# Patient Record
Sex: Female | Born: 1960 | Race: White | Hispanic: No | Marital: Married | Smoking: Current every day smoker
Health system: Southern US, Community
[De-identification: ages and names within clinical notes are randomized; demographics above are authoritative.]

## PROBLEM LIST (undated history)

## (undated) DIAGNOSIS — F419 Anxiety disorder, unspecified: Secondary | ICD-10-CM

## (undated) DIAGNOSIS — K219 Gastro-esophageal reflux disease without esophagitis: Secondary | ICD-10-CM

## (undated) DIAGNOSIS — C3490 Malignant neoplasm of unspecified part of unspecified bronchus or lung: Secondary | ICD-10-CM

## (undated) DIAGNOSIS — Z720 Tobacco use: Secondary | ICD-10-CM

## (undated) DIAGNOSIS — F32A Depression, unspecified: Secondary | ICD-10-CM

## (undated) DIAGNOSIS — J439 Emphysema, unspecified: Secondary | ICD-10-CM

## (undated) DIAGNOSIS — M810 Age-related osteoporosis without current pathological fracture: Secondary | ICD-10-CM

## (undated) HISTORY — DX: Malignant neoplasm of unspecified part of unspecified bronchus or lung: C34.90

## (undated) HISTORY — PX: TUBAL LIGATION: SHX77

## (undated) HISTORY — PX: HERNIA REPAIR: SHX51

## (undated) HISTORY — DX: Age-related osteoporosis without current pathological fracture: M81.0

## (undated) HISTORY — PX: CLEFT PALATE REPAIR: SUR1165

## (undated) HISTORY — DX: Depression, unspecified: F32.A

## (undated) HISTORY — DX: Anxiety disorder, unspecified: F41.9

## (undated) HISTORY — DX: Gastro-esophageal reflux disease without esophagitis: K21.9

## (undated) HISTORY — DX: Tobacco use: Z72.0

## (undated) HISTORY — DX: Emphysema, unspecified: J43.9

## (undated) HISTORY — PX: BACK SURGERY: SHX140

---

## 1998-03-06 ENCOUNTER — Emergency Department (HOSPITAL_COMMUNITY): Admission: EM | Admit: 1998-03-06 | Discharge: 1998-03-06 | Payer: Self-pay | Admitting: Emergency Medicine

## 1998-03-09 ENCOUNTER — Ambulatory Visit (HOSPITAL_COMMUNITY): Admission: RE | Admit: 1998-03-09 | Discharge: 1998-03-09 | Payer: Self-pay | Admitting: Obstetrics and Gynecology

## 1998-04-20 ENCOUNTER — Encounter: Admission: RE | Admit: 1998-04-20 | Discharge: 1998-06-30 | Payer: Self-pay | Admitting: Anesthesiology

## 1998-08-06 ENCOUNTER — Encounter: Payer: Self-pay | Admitting: Orthopedic Surgery

## 1998-08-06 ENCOUNTER — Inpatient Hospital Stay (HOSPITAL_COMMUNITY): Admission: RE | Admit: 1998-08-06 | Discharge: 1998-08-10 | Payer: Self-pay | Admitting: Orthopedic Surgery

## 1999-05-25 ENCOUNTER — Other Ambulatory Visit: Admission: RE | Admit: 1999-05-25 | Discharge: 1999-05-25 | Payer: Self-pay | Admitting: Obstetrics and Gynecology

## 1999-06-07 ENCOUNTER — Encounter: Admission: RE | Admit: 1999-06-07 | Discharge: 1999-09-05 | Payer: Self-pay | Admitting: Obstetrics and Gynecology

## 1999-12-19 ENCOUNTER — Encounter: Admission: RE | Admit: 1999-12-19 | Discharge: 2000-03-18 | Payer: Self-pay | Admitting: Anesthesiology

## 2000-12-07 ENCOUNTER — Other Ambulatory Visit: Admission: RE | Admit: 2000-12-07 | Discharge: 2000-12-07 | Payer: Self-pay | Admitting: Obstetrics and Gynecology

## 2002-01-03 ENCOUNTER — Other Ambulatory Visit: Admission: RE | Admit: 2002-01-03 | Discharge: 2002-01-03 | Payer: Self-pay | Admitting: Obstetrics and Gynecology

## 2003-11-05 ENCOUNTER — Emergency Department (HOSPITAL_COMMUNITY): Admission: EM | Admit: 2003-11-05 | Discharge: 2003-11-05 | Payer: Self-pay | Admitting: Emergency Medicine

## 2005-02-08 ENCOUNTER — Emergency Department (HOSPITAL_COMMUNITY): Admission: EM | Admit: 2005-02-08 | Discharge: 2005-02-08 | Payer: Self-pay | Admitting: Emergency Medicine

## 2005-04-16 ENCOUNTER — Emergency Department (HOSPITAL_COMMUNITY): Admission: EM | Admit: 2005-04-16 | Discharge: 2005-04-16 | Payer: Self-pay | Admitting: Emergency Medicine

## 2005-04-19 ENCOUNTER — Emergency Department (HOSPITAL_COMMUNITY): Admission: EM | Admit: 2005-04-19 | Discharge: 2005-04-20 | Payer: Self-pay | Admitting: Emergency Medicine

## 2005-04-20 ENCOUNTER — Emergency Department (HOSPITAL_COMMUNITY): Admission: EM | Admit: 2005-04-20 | Discharge: 2005-04-20 | Payer: Self-pay | Admitting: Family Medicine

## 2013-08-01 ENCOUNTER — Other Ambulatory Visit (HOSPITAL_COMMUNITY)
Admission: RE | Admit: 2013-08-01 | Discharge: 2013-08-01 | Disposition: A | Discharge status: Home / Self Care | Payer: BC Managed Care – PPO | Source: Ambulatory Visit | Attending: Obstetrics & Gynecology | Admitting: Obstetrics & Gynecology

## 2013-08-01 ENCOUNTER — Other Ambulatory Visit: Payer: Self-pay | Admitting: Obstetrics & Gynecology

## 2013-08-01 DIAGNOSIS — Z1151 Encounter for screening for human papillomavirus (HPV): Secondary | ICD-10-CM | POA: Insufficient documentation

## 2013-08-01 DIAGNOSIS — Z01419 Encounter for gynecological examination (general) (routine) without abnormal findings: Secondary | ICD-10-CM | POA: Insufficient documentation

## 2014-03-06 ENCOUNTER — Ambulatory Visit (INDEPENDENT_AMBULATORY_CARE_PROVIDER_SITE_OTHER): Payer: BC Managed Care – PPO | Admitting: Cardiology

## 2014-03-06 ENCOUNTER — Encounter: Payer: Self-pay | Admitting: Cardiology

## 2014-03-06 VITALS — BP 146/92 | HR 70 | Ht 64.0 in | Wt 128.0 lb

## 2014-03-06 DIAGNOSIS — R072 Precordial pain: Secondary | ICD-10-CM

## 2014-03-06 DIAGNOSIS — Z720 Tobacco use: Secondary | ICD-10-CM | POA: Insufficient documentation

## 2014-03-06 DIAGNOSIS — R55 Syncope and collapse: Secondary | ICD-10-CM

## 2014-03-06 DIAGNOSIS — R079 Chest pain, unspecified: Secondary | ICD-10-CM | POA: Insufficient documentation

## 2014-03-06 DIAGNOSIS — F17200 Nicotine dependence, unspecified, uncomplicated: Secondary | ICD-10-CM

## 2014-03-06 NOTE — Progress Notes (Signed)
     HPI: 53 yo female for evaluation of chest pain. Exercise treadmill performed in [facility] in August of 2015 showed no chest pain and no ST changes. Echocardiogram August 2015 showed normal LV function. Mild mitral and tricuspid regurgitation. Patient was in [place] approximately one month ago. While in a casino she developed sudden nausea. She went to the bathroom and then developed diaphoresis as well as pain in the epigastric/left breast area described as a squeezing sensation. There is radiation to her left upper extremity. Her pain lasted approximately 20 minutes. She denies shortness of breath. She left the bathroom and then developed weakness and laid on the floor. Apparently EMS was called and her blood pressure was somewhat low. She declined to go to the hospital. She subsequently had the above tests performed after returning home. She has had 2 brief episodes when she felt her symptoms might return but otherwise denies dyspnea on exertion, orthopnea, PND, pedal edema, palpitations, syncope or exertional chest pain.  Current Outpatient Prescriptions  Medication Sig Dispense Refill  . estrogen, conjugated,-medroxyprogesterone (PREMPRO) 0.3-1.5 MG per tablet Take 1 tablet by mouth daily.       No current facility-administered medications for this visit.    No Known Allergies   Past Medical History  Diagnosis Date  . Tobacco use     Past Surgical History  Procedure Laterality Date  . Back surgery    . Tubal ligation    . Cleft palate repair      History   Social History  . Marital Status: Married    Spouse Name: N/A    Number of Children: 2  . Years of Education: N/A   Occupational History  .      Customer Financial planner   Social History Main Topics  . Smoking status: Current Every Day Smoker -- 1.00 packs/day    Types: Cigarettes  . Smokeless tobacco: Not on file  . Alcohol Use: No  . Drug Use: Not on file  . Sexual Activity: Not on file    Other Topics Concern  . Not on file   Social History Narrative  . No narrative on file    Family History  Problem Relation Age of Onset  . CAD Mother     CABG at age 62    ROS: no fevers or chills, productive cough, hemoptysis, dysphasia, odynophagia, melena, hematochezia, dysuria, hematuria, rash, seizure activity, orthopnea, PND, pedal edema, claudication. Remaining systems are negative.  Physical Exam:   Blood pressure 146/92, pulse 70, height  (1.626 m), weight 128 lb (58.06 kg).  General:  Well developed/well nourished in NAD Skin warm/dry Patient not depressed No peripheral clubbing Back-normal HEENT-normal/normal eyelids Neck supple/normal carotid upstroke bilaterally; no bruits; no JVD; no thyromegaly chest - CTA/ normal expansion CV - RRR/normal S1 and S2; no murmurs, rubs or gallops;  PMI nondisplaced Abdomen -NT/ND, no HSM, no mass, + bowel sounds, no bruit 2+ femoral pulses, no bruits Ext-no edema, chords, 2+ DP Neuro-grossly nonfocal  ECG Sinus rhythm at a rate of 52. No ST changes.

## 2014-03-06 NOTE — Assessment & Plan Note (Signed)
Etiology of symptoms unclear. Heart description is concerning for cardiac etiology. However she has had no exertional symptoms since returning home. Recent exercise treadmill negative. Echocardiogram showed normal LV function. Electrocardiogram today normal. Question gallbladder. Since she does not have right upper quadrant tenderness. I have elected to follow her. If she has no further symptoms then we will not pursue further workup. If she has further episodes in the future we will consider gallbladder ultrasound or further cardiac workup as needed.

## 2014-03-06 NOTE — Patient Instructions (Signed)
Your physician recommends that you schedule a follow-up appointment in: 3 MONTHS WITH DR CRENSHAW  

## 2014-03-06 NOTE — Assessment & Plan Note (Signed)
Patient counseled on discontinuing. 

## 2014-05-25 NOTE — Progress Notes (Signed)
      HPI: FU chest pain. Exercise treadmill performed in [facility] in August of 2015 showed no chest pain and no ST changes. Echocardiogram August 2015 showed normal LV function. Mild mitral and tricuspid regurgitation. At last office visit we electedObservation. If she had any recurrent symptoms we would consider further cardiac evaluation or gallbladder ultrasound. Since she was last seen,   Current Outpatient Prescriptions  Medication Sig Dispense Refill  . estrogen, conjugated,-medroxyprogesterone (PREMPRO) 0.3-1.5 MG per tablet Take 1 tablet by mouth daily.     No current facility-administered medications for this visit.     Past Medical History  Diagnosis Date  . Tobacco use     Past Surgical History  Procedure Laterality Date  . Back surgery    . Tubal ligation    . Cleft palate repair      History   Social History  . Marital Status: Married    Spouse Name: N/A    Number of Children: 2  . Years of Education: N/A   Occupational History  .      Customer Financial plannerservice manager   Social History Main Topics  . Smoking status: Current Every Day Smoker -- 1.00 packs/day    Types: Cigarettes  . Smokeless tobacco: Not on file  . Alcohol Use: No  . Drug Use: Not on file  . Sexual Activity: Not on file   Other Topics Concern  . Not on file   Social History Narrative  . No narrative on file    ROS: no fevers or chills, productive cough, hemoptysis, dysphasia, odynophagia, melena, hematochezia, dysuria, hematuria, rash, seizure activity, orthopnea, PND, pedal edema, claudication. Remaining systems are negative.  Physical Exam: Well-developed well-nourished in no acute distress.  Skin is warm and dry.  HEENT is normal.  Neck is supple.  Chest is clear to auscultation with normal expansion.  Cardiovascular exam is regular rate and rhythm.  Abdominal exam nontender or distended. No masses palpated. Extremities show no edema. neuro grossly  intact  ECG     This encounter was created in error - please disregard.

## 2014-05-29 ENCOUNTER — Encounter: Payer: BC Managed Care – PPO | Admitting: Cardiology

## 2014-08-10 ENCOUNTER — Ambulatory Visit: Payer: BC Managed Care – PPO | Admitting: Cardiology

## 2014-10-07 NOTE — Progress Notes (Signed)
      HPI: FU chest pain. Exercise treadmill performed in [facility] in August of 2015 showed no chest pain and no ST changes. Echocardiogram August 2015 showed normal LV function. Mild mitral and tricuspid regurgitation. Since last seen,    Current Outpatient Prescriptions  Medication Sig Dispense Refill  . estrogen, conjugated,-medroxyprogesterone (PREMPRO) 0.3-1.5 MG per tablet Take 1 tablet by mouth daily.     No current facility-administered medications for this visit.     Past Medical History  Diagnosis Date  . Tobacco use     Past Surgical History  Procedure Laterality Date  . Back surgery    . Tubal ligation    . Cleft palate repair      History   Social History  . Marital Status: Married    Spouse Name: N/A  . Number of Children: 2  . Years of Education: N/A   Occupational History  .      Customer Financial plannerservice manager   Social History Main Topics  . Smoking status: Current Every Day Smoker -- 1.00 packs/day    Types: Cigarettes  . Smokeless tobacco: Not on file  . Alcohol Use: No  . Drug Use: Not on file  . Sexual Activity: Not on file   Other Topics Concern  . Not on file   Social History Narrative  . No narrative on file    ROS: no fevers or chills, productive cough, hemoptysis, dysphasia, odynophagia, melena, hematochezia, dysuria, hematuria, rash, seizure activity, orthopnea, PND, pedal edema, claudication. Remaining systems are negative.  Physical Exam: Well-developed well-nourished in no acute distress.  Skin is warm and dry.  HEENT is normal.  Neck is supple.  Chest is clear to auscultation with normal expansion.  Cardiovascular exam is regular rate and rhythm.  Abdominal exam nontender or distended. No masses palpated. Extremities show no edema. neuro grossly intact  ECG     This encounter was created in error - please disregard.

## 2014-10-12 ENCOUNTER — Encounter: Payer: Self-pay | Admitting: Cardiology

## 2015-09-07 ENCOUNTER — Other Ambulatory Visit: Payer: Self-pay

## 2015-09-07 DIAGNOSIS — Z1231 Encounter for screening mammogram for malignant neoplasm of breast: Secondary | ICD-10-CM

## 2015-09-20 ENCOUNTER — Ambulatory Visit
Admission: RE | Admit: 2015-09-20 | Discharge: 2015-09-20 | Disposition: A | Discharge status: Home / Self Care | Payer: BLUE CROSS/BLUE SHIELD | Source: Ambulatory Visit

## 2015-09-20 DIAGNOSIS — Z1231 Encounter for screening mammogram for malignant neoplasm of breast: Secondary | ICD-10-CM

## 2016-07-12 ENCOUNTER — Other Ambulatory Visit: Payer: Self-pay | Admitting: Obstetrics & Gynecology

## 2016-07-12 ENCOUNTER — Other Ambulatory Visit (HOSPITAL_COMMUNITY)
Admission: RE | Admit: 2016-07-12 | Discharge: 2016-07-12 | Disposition: A | Discharge status: Home / Self Care | Payer: BLUE CROSS/BLUE SHIELD | Source: Ambulatory Visit | Attending: Obstetrics & Gynecology | Admitting: Obstetrics & Gynecology

## 2016-07-12 DIAGNOSIS — Z1151 Encounter for screening for human papillomavirus (HPV): Secondary | ICD-10-CM | POA: Insufficient documentation

## 2016-07-12 DIAGNOSIS — Z01419 Encounter for gynecological examination (general) (routine) without abnormal findings: Secondary | ICD-10-CM | POA: Insufficient documentation

## 2016-07-14 LAB — CYTOLOGY - PAP
Adequacy: ABSENT
Diagnosis: NEGATIVE
HPV: NOT DETECTED

## 2016-11-16 ENCOUNTER — Ambulatory Visit: Payer: Self-pay | Admitting: Podiatry

## 2016-12-20 ENCOUNTER — Ambulatory Visit (INDEPENDENT_AMBULATORY_CARE_PROVIDER_SITE_OTHER): Payer: BLUE CROSS/BLUE SHIELD | Admitting: Podiatry

## 2016-12-20 ENCOUNTER — Encounter: Payer: Self-pay | Admitting: Podiatry

## 2016-12-20 ENCOUNTER — Ambulatory Visit (INDEPENDENT_AMBULATORY_CARE_PROVIDER_SITE_OTHER): Payer: BLUE CROSS/BLUE SHIELD

## 2016-12-20 DIAGNOSIS — R52 Pain, unspecified: Secondary | ICD-10-CM

## 2016-12-20 DIAGNOSIS — M779 Enthesopathy, unspecified: Secondary | ICD-10-CM | POA: Diagnosis not present

## 2016-12-20 DIAGNOSIS — Q828 Other specified congenital malformations of skin: Secondary | ICD-10-CM

## 2016-12-20 DIAGNOSIS — M7732 Calcaneal spur, left foot: Secondary | ICD-10-CM | POA: Diagnosis not present

## 2016-12-20 NOTE — Progress Notes (Signed)
   Subjective:    Patient ID: Alexis Davila, female    DOB: 1960/07/29, 56 y.o.   MRN: [id number]  HPI this patient presents the office with 2 separate complaints. She says she has a painful callus under the ball of her left foot. She says that that pain has been pain painful and present for over 6 months. She says she works at [employer]  and walks on concrete during her shift. She also says that she is been experiencing pain in her left heel upon rising. This is not a frequent occurrence but does occur at times . She has provided no self treatment or sought any professional help for her heel pain. She denies any trauma to her left foot. She presents the office today for an evaluation and treatment of her painful left foot. No heel pain noted today    Review of Systems  All other systems reviewed and are negative.      Objective:   Physical Exam GENERAL APPEARANCE: Alert, conversant. Appropriately groomed. No acute distress.  VASCULAR: Pedal pulses are  palpable at  [facility] bilateral.  Capillary refill time is immediate to all digits,  Normal temperature gradient.  Digital hair growth is present bilateral  NEUROLOGIC: sensation is normal to 5.07 monofilament at 5/5 sites bilateral.  Light touch is intact bilateral, Muscle strength normal.  MUSCULOSKELETAL: acceptable muscle strength, tone and stability bilateral.  Intrinsic muscluature intact bilateral.  Rectus appearance of foot and digits noted bilateral. No pain at the insertion plantar fascia left foot.  Plantar pain sub 2 left foot.  DERMATOLOGIC: skin color, texture, and turgor are within normal limits.  No preulcerative lesions or ulcers  are seen, no interdigital maceration noted.  No open lesions present.  Digital nails are asymptomatic. No drainage noted. Porokeratotic lesion sub 2 left foot.         Assessment & Plan:  Plantar fasciitis, left foot . Capsulitis second MPJ left foot  . Porokeratosis sub-2 left foot  IE   x-rays were taken and no bony pathology was noted, but it was determined she had an elongated first metatarsal both feet.  She was treated with debridement of the porokeratosis at this visit.  She was advised to pick up three-quarter Spenco orthotic insole from [employer] and wear when she works.  Discussed additional treatment such as medication and injection therapy.  If the problem persists we will proceed with one of the above treatments. Return to clinic when necessary   Helane GuntherGregory Analeah Brame DPM

## 2017-10-30 ENCOUNTER — Other Ambulatory Visit: Payer: Self-pay | Admitting: Obstetrics & Gynecology

## 2017-10-30 DIAGNOSIS — Z139 Encounter for screening, unspecified: Secondary | ICD-10-CM

## 2017-11-22 ENCOUNTER — Ambulatory Visit
Admission: RE | Admit: 2017-11-22 | Discharge: 2017-11-22 | Disposition: A | Discharge status: Home / Self Care | Payer: BLUE CROSS/BLUE SHIELD | Source: Ambulatory Visit | Attending: Obstetrics & Gynecology | Admitting: Obstetrics & Gynecology

## 2017-11-22 DIAGNOSIS — Z139 Encounter for screening, unspecified: Secondary | ICD-10-CM

## 2019-07-21 ENCOUNTER — Other Ambulatory Visit: Payer: Self-pay | Admitting: Obstetrics & Gynecology

## 2019-07-21 DIAGNOSIS — Z1231 Encounter for screening mammogram for malignant neoplasm of breast: Secondary | ICD-10-CM

## 2019-08-27 ENCOUNTER — Ambulatory Visit
Admission: RE | Admit: 2019-08-27 | Discharge: 2019-08-27 | Disposition: A | Discharge status: Home / Self Care | Payer: BC Managed Care – PPO | Source: Ambulatory Visit | Attending: Obstetrics & Gynecology | Admitting: Obstetrics & Gynecology

## 2019-08-27 ENCOUNTER — Other Ambulatory Visit: Payer: Self-pay

## 2019-08-27 DIAGNOSIS — Z1231 Encounter for screening mammogram for malignant neoplasm of breast: Secondary | ICD-10-CM

## 2020-07-02 IMAGING — MG DIGITAL SCREENING BILAT W/ TOMO W/ CAD
8 series · 9 of 24 positions shown · non-contrast
Comparison: Previous exam(s).

CLINICAL DATA: Screening.

EXAM:
DIGITAL SCREENING BILATERAL MAMMOGRAM WITH TOMO AND CAD

[R MLO synth-2D]
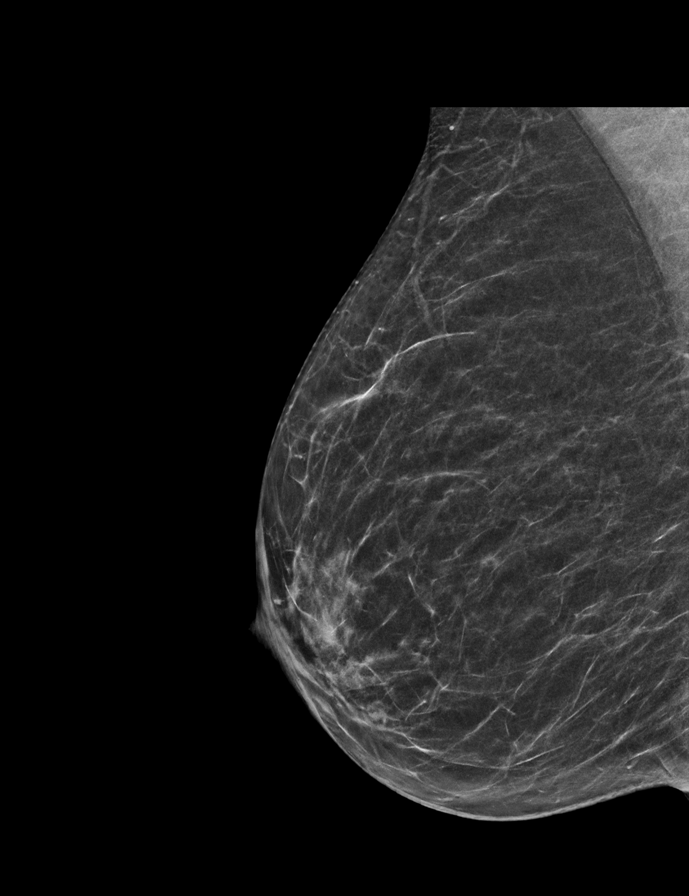

[R CC synth-2D]
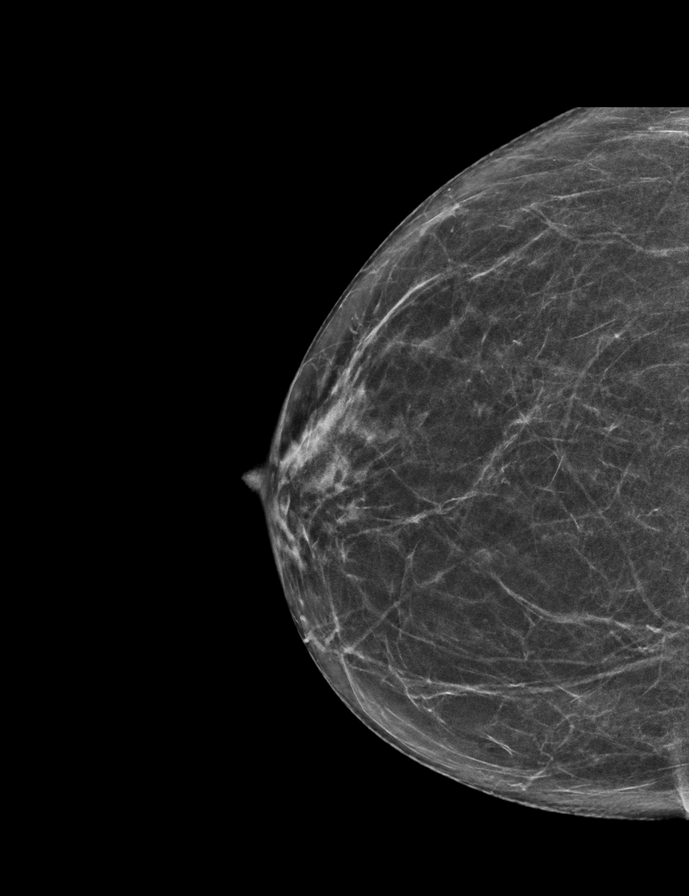

[L MLO synth-2D]
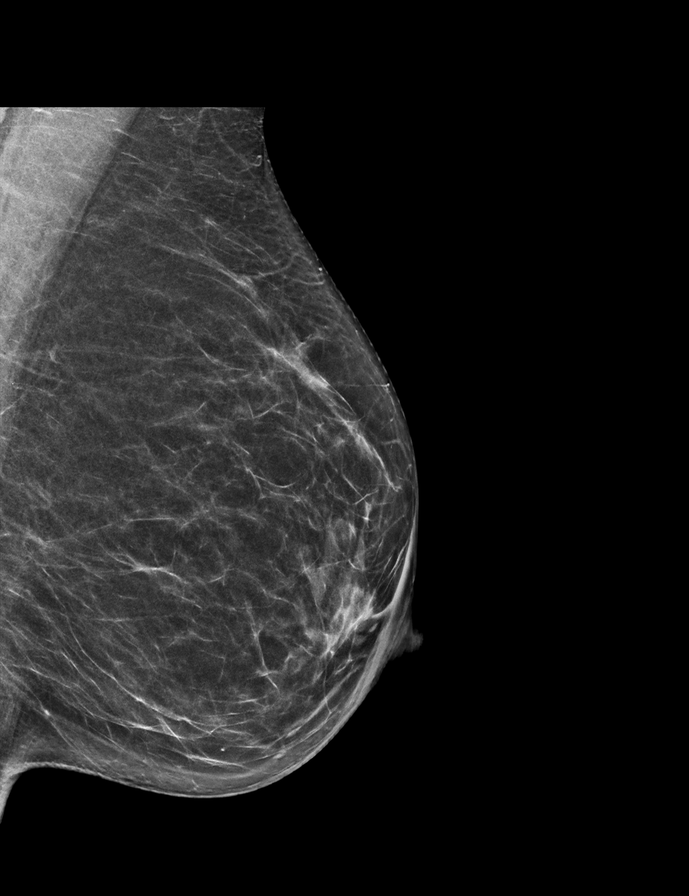

[L CC synth-2D]
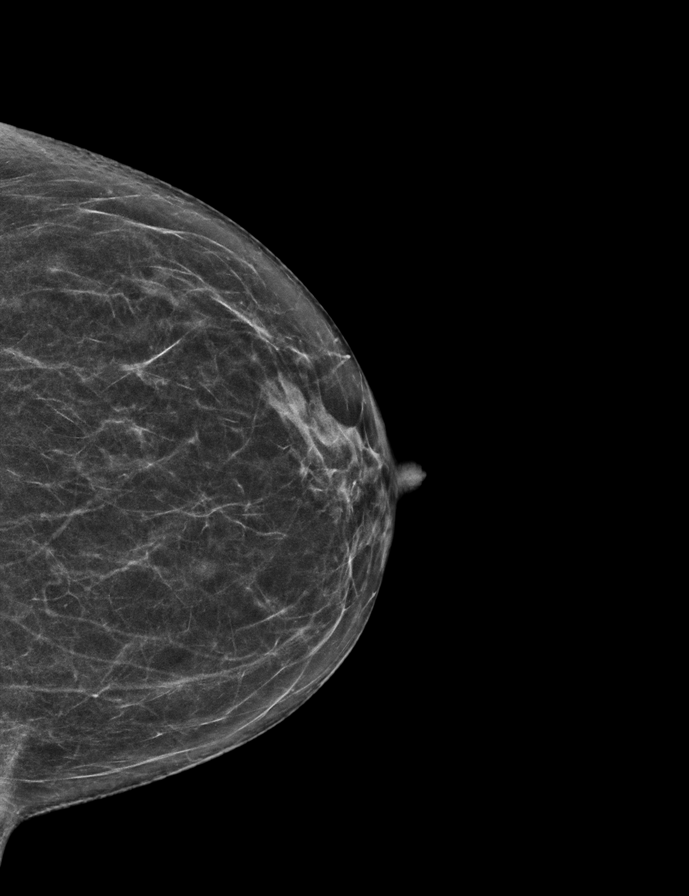

[R CC tomo · 2 of 53 frames shown]
[frame 18/53]
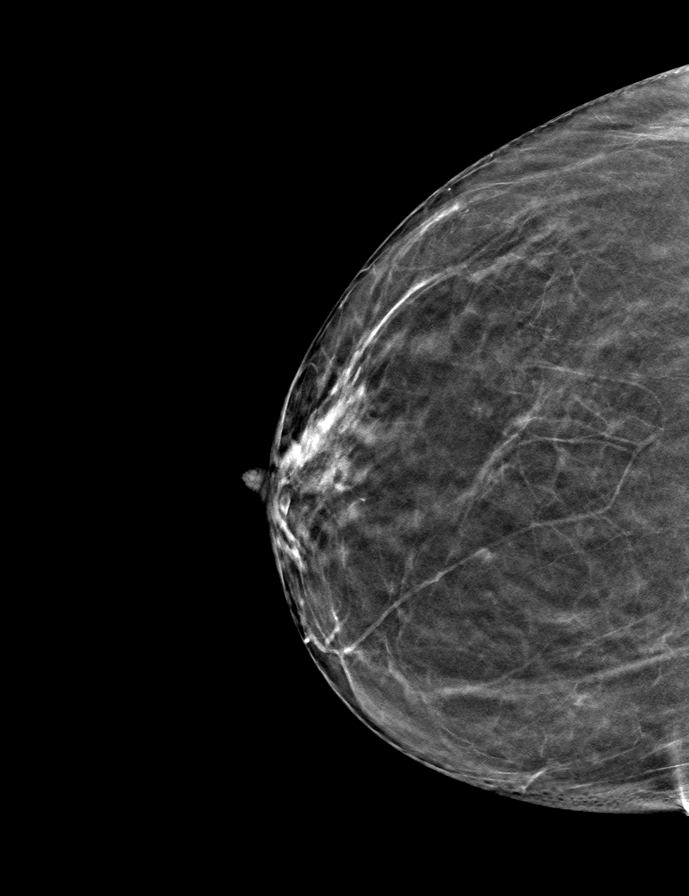
[frame 27/53]
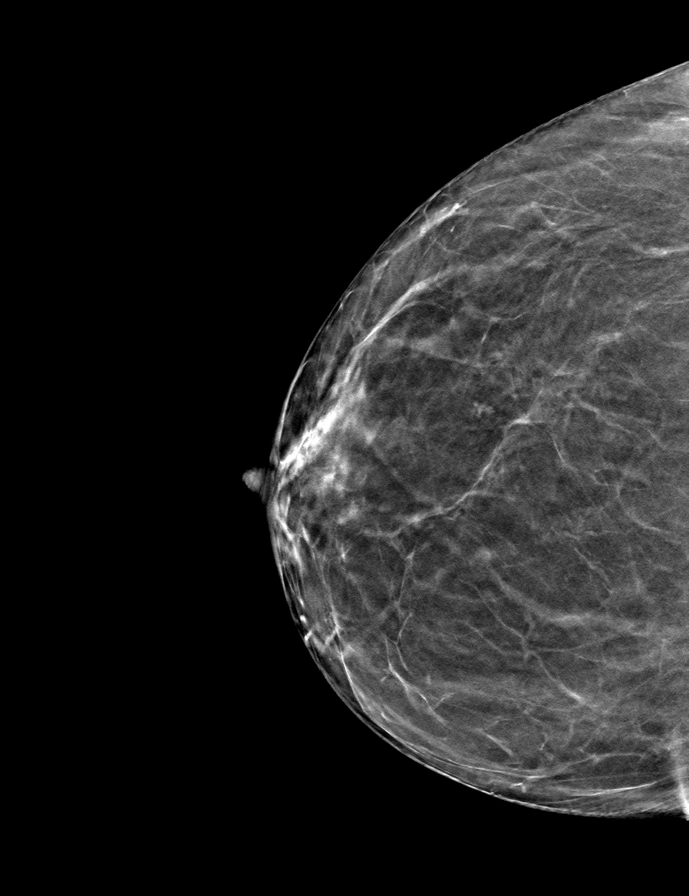

[L MLO tomo · tomo slice 30/59.0]
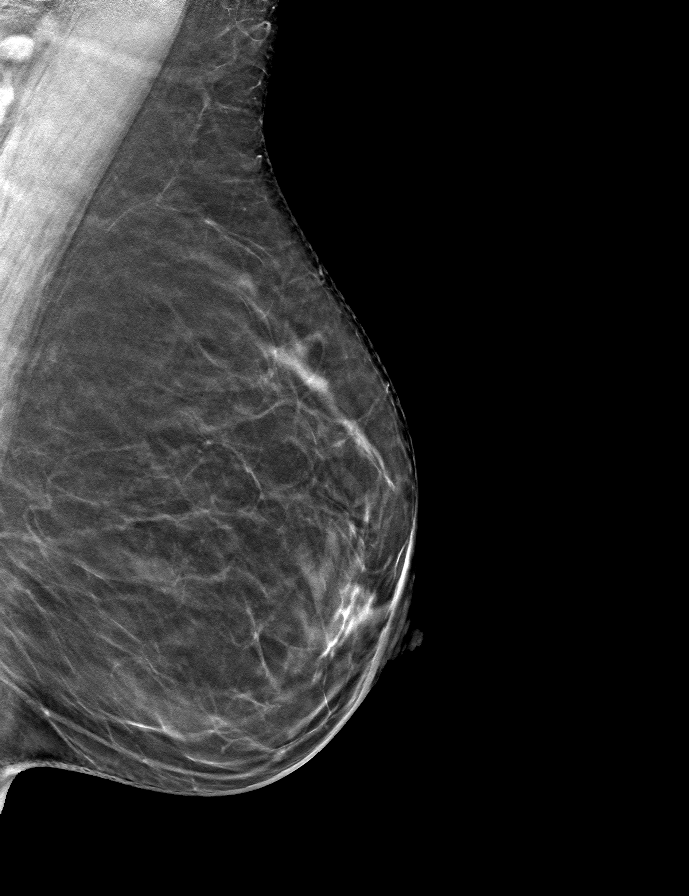

[L CC tomo · tomo slice 27/54.0]
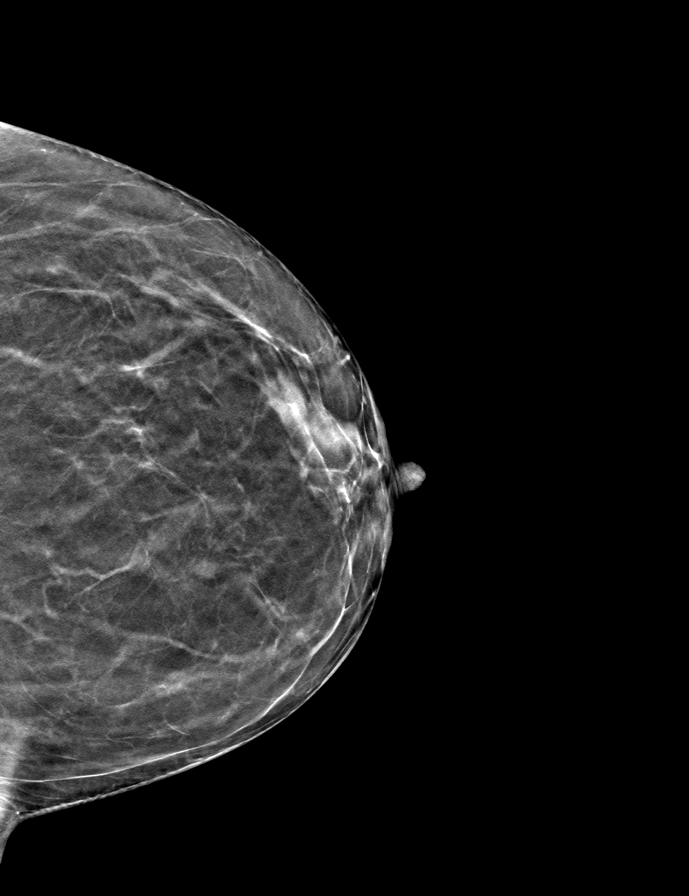

[R MLO tomo · tomo slice 29/56.0]
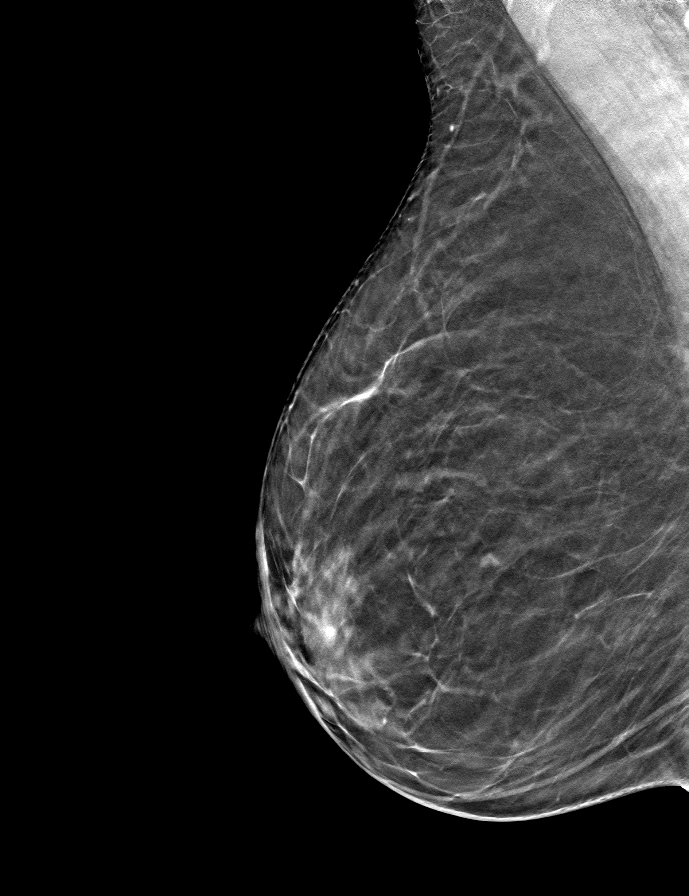

[9 of 24 positions shown; findings below may reference images not displayed]

ACR Breast Density Category b: There are scattered areas of
fibroglandular density.
FINDINGS: There are no findings suspicious for malignancy. Images were
processed with CAD.
IMPRESSION: No mammographic evidence of malignancy. A result letter of this
screening mammogram will be mailed directly to the patient.

RECOMMENDATION:
Screening mammogram in one year. (Code:[id number])

BI-RADS CATEGORY  1: Negative.

## 2021-06-01 DIAGNOSIS — K4090 Unilateral inguinal hernia, without obstruction or gangrene, not specified as recurrent: Secondary | ICD-10-CM | POA: Diagnosis not present

## 2021-11-17 DIAGNOSIS — J3489 Other specified disorders of nose and nasal sinuses: Secondary | ICD-10-CM | POA: Diagnosis not present

## 2021-11-17 DIAGNOSIS — Z716 Tobacco abuse counseling: Secondary | ICD-10-CM | POA: Diagnosis not present

## 2021-11-17 DIAGNOSIS — J9801 Acute bronchospasm: Secondary | ICD-10-CM | POA: Diagnosis not present

## 2021-11-17 DIAGNOSIS — F17210 Nicotine dependence, cigarettes, uncomplicated: Secondary | ICD-10-CM | POA: Diagnosis not present

## 2021-11-29 DIAGNOSIS — E119 Type 2 diabetes mellitus without complications: Secondary | ICD-10-CM | POA: Diagnosis not present

## 2021-11-29 DIAGNOSIS — K219 Gastro-esophageal reflux disease without esophagitis: Secondary | ICD-10-CM | POA: Diagnosis not present

## 2021-11-29 DIAGNOSIS — E559 Vitamin D deficiency, unspecified: Secondary | ICD-10-CM | POA: Diagnosis not present

## 2021-11-29 DIAGNOSIS — E7800 Pure hypercholesterolemia, unspecified: Secondary | ICD-10-CM | POA: Diagnosis not present

## 2021-11-29 DIAGNOSIS — F430 Acute stress reaction: Secondary | ICD-10-CM | POA: Diagnosis not present

## 2022-01-23 DIAGNOSIS — F430 Acute stress reaction: Secondary | ICD-10-CM | POA: Diagnosis not present

## 2022-01-23 DIAGNOSIS — F419 Anxiety disorder, unspecified: Secondary | ICD-10-CM | POA: Diagnosis not present

## 2022-04-20 DIAGNOSIS — J209 Acute bronchitis, unspecified: Secondary | ICD-10-CM | POA: Diagnosis not present

## 2022-04-20 DIAGNOSIS — K4090 Unilateral inguinal hernia, without obstruction or gangrene, not specified as recurrent: Secondary | ICD-10-CM | POA: Diagnosis not present

## 2022-05-15 DIAGNOSIS — Z1211 Encounter for screening for malignant neoplasm of colon: Secondary | ICD-10-CM | POA: Diagnosis not present

## 2022-05-15 DIAGNOSIS — K4090 Unilateral inguinal hernia, without obstruction or gangrene, not specified as recurrent: Secondary | ICD-10-CM | POA: Diagnosis not present

## 2022-06-07 DIAGNOSIS — Z79899 Other long term (current) drug therapy: Secondary | ICD-10-CM | POA: Diagnosis not present

## 2022-06-07 DIAGNOSIS — K4090 Unilateral inguinal hernia, without obstruction or gangrene, not specified as recurrent: Secondary | ICD-10-CM | POA: Diagnosis not present

## 2022-08-14 DIAGNOSIS — Z01818 Encounter for other preprocedural examination: Secondary | ICD-10-CM | POA: Diagnosis not present

## 2022-08-30 DIAGNOSIS — R0781 Pleurodynia: Secondary | ICD-10-CM | POA: Diagnosis not present

## 2022-10-03 DIAGNOSIS — M7711 Lateral epicondylitis, right elbow: Secondary | ICD-10-CM | POA: Diagnosis not present

## 2022-11-17 DIAGNOSIS — Z0000 Encounter for general adult medical examination without abnormal findings: Secondary | ICD-10-CM | POA: Diagnosis not present

## 2022-12-06 DIAGNOSIS — Z1231 Encounter for screening mammogram for malignant neoplasm of breast: Secondary | ICD-10-CM | POA: Diagnosis not present

## 2022-12-19 DIAGNOSIS — M7711 Lateral epicondylitis, right elbow: Secondary | ICD-10-CM | POA: Diagnosis not present

## 2022-12-19 DIAGNOSIS — G8929 Other chronic pain: Secondary | ICD-10-CM | POA: Diagnosis not present

## 2022-12-19 DIAGNOSIS — M545 Low back pain, unspecified: Secondary | ICD-10-CM | POA: Diagnosis not present

## 2022-12-25 DIAGNOSIS — M25521 Pain in right elbow: Secondary | ICD-10-CM | POA: Diagnosis not present

## 2022-12-25 DIAGNOSIS — S51851A Open bite of right forearm, initial encounter: Secondary | ICD-10-CM | POA: Diagnosis not present

## 2022-12-25 DIAGNOSIS — M7711 Lateral epicondylitis, right elbow: Secondary | ICD-10-CM | POA: Diagnosis not present

## 2023-01-09 DIAGNOSIS — M5451 Vertebrogenic low back pain: Secondary | ICD-10-CM | POA: Diagnosis not present

## 2023-02-16 DIAGNOSIS — R636 Underweight: Secondary | ICD-10-CM | POA: Diagnosis not present

## 2023-02-16 DIAGNOSIS — R1110 Vomiting, unspecified: Secondary | ICD-10-CM | POA: Diagnosis not present

## 2023-02-16 DIAGNOSIS — F3341 Major depressive disorder, recurrent, in partial remission: Secondary | ICD-10-CM | POA: Diagnosis not present

## 2023-02-16 DIAGNOSIS — Z13220 Encounter for screening for lipoid disorders: Secondary | ICD-10-CM | POA: Diagnosis not present

## 2023-02-16 DIAGNOSIS — K219 Gastro-esophageal reflux disease without esophagitis: Secondary | ICD-10-CM | POA: Diagnosis not present

## 2023-02-16 DIAGNOSIS — Z1389 Encounter for screening for other disorder: Secondary | ICD-10-CM | POA: Diagnosis not present

## 2023-02-16 DIAGNOSIS — Z681 Body mass index (BMI) 19 or less, adult: Secondary | ICD-10-CM | POA: Diagnosis not present

## 2023-02-19 ENCOUNTER — Other Ambulatory Visit: Payer: Self-pay | Admitting: Family Medicine

## 2023-02-19 DIAGNOSIS — F17200 Nicotine dependence, unspecified, uncomplicated: Secondary | ICD-10-CM

## 2023-03-08 ENCOUNTER — Ambulatory Visit
Admission: RE | Admit: 2023-03-08 | Discharge: 2023-03-08 | Disposition: A | Discharge status: Home / Self Care | Payer: BC Managed Care – PPO | Source: Ambulatory Visit | Attending: Family Medicine | Admitting: Family Medicine

## 2023-03-08 DIAGNOSIS — F17210 Nicotine dependence, cigarettes, uncomplicated: Secondary | ICD-10-CM | POA: Diagnosis not present

## 2023-03-08 DIAGNOSIS — F17200 Nicotine dependence, unspecified, uncomplicated: Secondary | ICD-10-CM

## 2023-03-09 DIAGNOSIS — R1110 Vomiting, unspecified: Secondary | ICD-10-CM | POA: Diagnosis not present

## 2023-03-09 DIAGNOSIS — R030 Elevated blood-pressure reading, without diagnosis of hypertension: Secondary | ICD-10-CM | POA: Diagnosis not present

## 2023-03-09 DIAGNOSIS — F3341 Major depressive disorder, recurrent, in partial remission: Secondary | ICD-10-CM | POA: Diagnosis not present

## 2023-03-09 DIAGNOSIS — F17210 Nicotine dependence, cigarettes, uncomplicated: Secondary | ICD-10-CM | POA: Diagnosis not present

## 2023-03-13 DIAGNOSIS — M7711 Lateral epicondylitis, right elbow: Secondary | ICD-10-CM | POA: Diagnosis not present

## 2023-03-21 ENCOUNTER — Ambulatory Visit (INDEPENDENT_AMBULATORY_CARE_PROVIDER_SITE_OTHER): Payer: BC Managed Care – PPO | Admitting: Pulmonary Disease

## 2023-03-21 ENCOUNTER — Encounter: Payer: Self-pay | Admitting: Pulmonary Disease

## 2023-03-21 VITALS — BP 140/80 | HR 76 | Ht 64.0 in | Wt 101.0 lb

## 2023-03-21 DIAGNOSIS — J432 Centrilobular emphysema: Secondary | ICD-10-CM | POA: Diagnosis not present

## 2023-03-21 DIAGNOSIS — R911 Solitary pulmonary nodule: Secondary | ICD-10-CM

## 2023-03-21 DIAGNOSIS — Z720 Tobacco use: Secondary | ICD-10-CM | POA: Diagnosis not present

## 2023-03-21 DIAGNOSIS — Z716 Tobacco abuse counseling: Secondary | ICD-10-CM | POA: Diagnosis not present

## 2023-03-21 NOTE — Patient Instructions (Signed)
Thank you for visiting Dr. Tonia Brooms at [facility]. Today we recommend the following: Orders Placed This Encounter  Procedures   NM PET Image Initial (PI) Skull Base To Thigh (F-18 FDG)   Pulmonary Function Test   Return in about 3 weeks (around 04/11/2023) for with Kandice Robinsons, NP, or Dr. Tonia Brooms, after PFTs, after PET/CT Chest.    Please do your part to reduce the spread of COVID-19.

## 2023-03-21 NOTE — Progress Notes (Signed)
Synopsis: Referred in September 2024 for pulmonary nodule by Orpha Bur, MD  Subjective:   PATIENT ID: Alexis Davila GENDER: female DOB: 27-Oct-1960, MRN: [id number]  Chief Complaint  Patient presents with   Consult    Consult on lung nodule.    This is a 62 year old female longstanding history of tobacco use, mother with a history of emphysema and history of coronary disease.  Patient has smoked for 40+ years.  Still smoking 1 pack/day.  She had her first lung cancer screening CT ordered by her primary care provider and this was completed in August 2024 which revealed a 1.7 cm right upper lobe pulmonary nodule concerning for malignancy.  Patient was referred for next best steps in management.    Past Medical History:  Diagnosis Date   Tobacco use      Family History  Problem Relation Age of Onset   CAD Mother        CABG at age 43   Breast cancer Neg Hx      Past Surgical History:  Procedure Laterality Date   BACK SURGERY     CLEFT PALATE REPAIR     TUBAL LIGATION      Social History   Socioeconomic History   Marital status: Married    Spouse name: Not on file   Number of children: 2   Years of education: Not on file   Highest education level: Not on file  Occupational History    Comment: Aeronautical engineer  Tobacco Use   Smoking status: Every Day    Current packs/day: 1.00    Types: Cigarettes   Smokeless tobacco: Never   Tobacco comments:    Smokes a pack of cigarettes a day. 03/21/2023 [place]  Substance and Sexual Activity   Alcohol use: No   Drug use: No   Sexual activity: Not on file  Other Topics Concern   Not on file  Social History Narrative   Not on file   Social Determinants of Health   Financial Resource Strain: Not on file  Food Insecurity: Not on file  Transportation Needs: Not on file  Physical Activity: Not on file  Stress: Not on file  Social Connections: Not on file  Intimate Partner Violence: Not on file     No  Known Allergies   Outpatient Medications Prior to Visit  Medication Sig Dispense Refill   escitalopram (LEXAPRO) 20 MG tablet Take 30 mg by mouth daily.     ondansetron (ZOFRAN-ODT) 4 MG disintegrating tablet Take 4 mg by mouth 3 (three) times daily as needed.     pantoprazole (PROTONIX) 40 MG tablet Take 40 mg by mouth daily.     promethazine (PHENERGAN) 25 MG tablet Take 12.5 mg by mouth every 8 (eight) hours as needed.     estrogen, conjugated,-medroxyprogesterone (PREMPRO) 0.3-1.5 MG per tablet Take 1 tablet by mouth daily.     No facility-administered medications prior to visit.    Review of Systems  Constitutional:  Positive for weight loss. Negative for chills, fever and malaise/fatigue.  HENT:  Negative for hearing loss, sore throat and tinnitus.   Eyes:  Negative for blurred vision and double vision.  Respiratory:  Positive for cough and shortness of breath. Negative for hemoptysis, sputum production, wheezing and stridor.   Cardiovascular:  Negative for chest pain, palpitations, orthopnea, leg swelling and PND.  Gastrointestinal:  Negative for abdominal pain, constipation, diarrhea, heartburn, nausea and vomiting.  Genitourinary:  Negative for dysuria, hematuria and  urgency.  Musculoskeletal:  Negative for joint pain and myalgias.  Skin:  Negative for itching and rash.  Neurological:  Negative for dizziness, tingling, weakness and headaches.  Endo/Heme/Allergies:  Negative for environmental allergies. Does not bruise/bleed easily.  Psychiatric/Behavioral:  Negative for depression. The patient is not nervous/anxious and does not have insomnia.   All other systems reviewed and are negative.    Objective:  Physical Exam Vitals reviewed.  Constitutional:      General: She is not in acute distress.    Appearance: She is well-developed.     Comments: Thin low BMI, 17  HENT:     Head: Normocephalic and atraumatic.     Mouth/Throat:     Pharynx: No oropharyngeal exudate.   Eyes:     Conjunctiva/sclera: Conjunctivae normal.     Pupils: Pupils are equal, round, and reactive to light.  Neck:     Vascular: No JVD.     Trachea: No tracheal deviation.     Comments: Loss of supraclavicular fat Cardiovascular:     Rate and Rhythm: Normal rate and regular rhythm.     Heart sounds: S1 normal and S2 normal.     Comments: Distant heart tones Pulmonary:     Effort: No tachypnea or accessory muscle usage.     Breath sounds: No stridor. Decreased breath sounds (throughout all lung fields) present. No wheezing, rhonchi or rales.  Abdominal:     General: Bowel sounds are normal. There is no distension.     Palpations: Abdomen is soft.     Tenderness: There is no abdominal tenderness.  Musculoskeletal:        General: Deformity (muscle wasting ) present.  Skin:    General: Skin is warm and dry.     Capillary Refill: Capillary refill takes less than 2 seconds.     Findings: No rash.  Neurological:     Mental Status: She is alert and oriented to person, place, and time.  Psychiatric:        Behavior: Behavior normal.      Vitals:   03/21/23 1435  BP: (!) 140/80  Pulse: 76  SpO2: 100%  Weight: 101 lb (45.8 kg)  Height: 5\' 4"  (1.626 m)   100% on RA BMI Readings from Last 3 Encounters:  03/21/23 17.34 kg/m  03/06/14 21.97 kg/m   Wt Readings from Last 3 Encounters:  03/21/23 101 lb (45.8 kg)  03/06/14 128 lb (58.1 kg)     CBC No results found for: "WBC", "RBC", "HGB", "HCT", "PLT", "MCV", "MCH", "MCHC", "RDW", "LYMPHSABS", "MONOABS", "EOSABS", "BASOSABS"   Chest Imaging:  CT lung cancer screening: 17 mm right upper lobe pulmonary nodule macrolobulated spiculated margins concerning for malignancy. The patient's images have been independently reviewed by me.    Pulmonary Functions Testing Results:     No data to display          FeNO:   Pathology:   Echocardiogram:   Heart Catheterization:     Assessment & Plan:     ICD-10-CM    1. Lung nodule  R91.1 Pulmonary Function Test    NM PET Image Initial (PI) Skull Base To Thigh (F-18 FDG)    2. Tobacco use  Z72.0     3. Centrilobular emphysema (HCC)  J43.2       Discussion:  This is a 62 year old female seen today for abnormal lung cancer screening CT.  She has a upper lobe nodule in the right lung concerning for malignancy.  Plan:  I think the next best step for her is to have a nuclear medicine PET scan as well as PFTs. Based on this we will decide whether or not she is a candidate for surgery. We counseled heavily today on smoking cessation. I think she is going to be able to quit and will be able to be considered for surgical resection. Based on the PET scan we can make recommendations on whether or not she has got any bronchoscopy and biopsy or the not she can go directly for surgery. Based on her functional status I think she would potentially be able to go directly for surgery.    Current Outpatient Medications:    escitalopram (LEXAPRO) 20 MG tablet, Take 30 mg by mouth daily., Disp: , Rfl:    ondansetron (ZOFRAN-ODT) 4 MG disintegrating tablet, Take 4 mg by mouth 3 (three) times daily as needed., Disp: , Rfl:    pantoprazole (PROTONIX) 40 MG tablet, Take 40 mg by mouth daily., Disp: , Rfl:    promethazine (PHENERGAN) 25 MG tablet, Take 12.5 mg by mouth every 8 (eight) hours as needed., Disp: , Rfl:    Josephine Igo, DO [facility] 03/21/2023 3:42 PM

## 2023-03-23 DIAGNOSIS — Z681 Body mass index (BMI) 19 or less, adult: Secondary | ICD-10-CM | POA: Diagnosis not present

## 2023-03-23 DIAGNOSIS — F430 Acute stress reaction: Secondary | ICD-10-CM | POA: Diagnosis not present

## 2023-03-23 DIAGNOSIS — R911 Solitary pulmonary nodule: Secondary | ICD-10-CM | POA: Diagnosis not present

## 2023-03-28 ENCOUNTER — Encounter: Payer: Self-pay | Admitting: Pulmonary Disease

## 2023-03-30 ENCOUNTER — Encounter (HOSPITAL_COMMUNITY)
Admission: RE | Admit: 2023-03-30 | Discharge: 2023-03-30 | Disposition: A | Discharge status: Home / Self Care | Payer: BC Managed Care – PPO | Source: Ambulatory Visit | Attending: Pulmonary Disease

## 2023-03-30 DIAGNOSIS — R911 Solitary pulmonary nodule: Secondary | ICD-10-CM | POA: Insufficient documentation

## 2023-03-30 LAB — GLUCOSE, CAPILLARY: Glucose-Capillary: 101 mg/dL — ABNORMAL HIGH (ref 70–99)

## 2023-03-30 MED ORDER — FLUDEOXYGLUCOSE F - 18 (FDG) INJECTION
4.4700 | Freq: Once | INTRAVENOUS | Status: AC
  Administered 2023-03-30: 4.47 via INTRAVENOUS

## 2023-04-02 ENCOUNTER — Ambulatory Visit: Payer: BC Managed Care – PPO | Admitting: Pulmonary Disease

## 2023-04-02 DIAGNOSIS — R911 Solitary pulmonary nodule: Secondary | ICD-10-CM

## 2023-04-02 LAB — PULMONARY FUNCTION TEST
DL/VA % pred: 58 %
DL/VA: 2.43 ml/min/mmHg/L
DLCO cor % pred: 57 %
DLCO cor: 12.2 ml/min/mmHg
DLCO unc % pred: 57 %
DLCO unc: 12.2 ml/min/mmHg
FEF 25-75 Post: 1.13 L/s
FEF 25-75 Pre: 1.2 L/s
FEF2575-%Change-Post: -5 %
FEF2575-%Pred-Post: 47 %
FEF2575-%Pred-Pre: 50 %
FEV1-%Change-Post: -2 %
FEV1-%Pred-Post: 76 %
FEV1-%Pred-Pre: 77 %
FEV1-Post: 2.03 L
FEV1-Pre: 2.08 L
FEV1FVC-%Change-Post: -3 %
FEV1FVC-%Pred-Pre: 86 %
FEV6-%Change-Post: 0 %
FEV6-%Pred-Post: 91 %
FEV6-%Pred-Pre: 91 %
FEV6-Post: 3.04 L
FEV6-Pre: 3.06 L
FEV6FVC-%Change-Post: -1 %
FEV6FVC-%Pred-Post: 101 %
FEV6FVC-%Pred-Pre: 102 %
FVC-%Change-Post: 1 %
FVC-%Pred-Post: 89 %
FVC-%Pred-Pre: 88 %
FVC-Post: 3.11 L
FVC-Pre: 3.08 L
Post FEV1/FVC ratio: 65 %
Post FEV6/FVC ratio: 98 %
Pre FEV1/FVC ratio: 67 %
Pre FEV6/FVC Ratio: 99 %
RV % pred: 128 %
RV: 2.68 L
TLC % pred: 108 %
TLC: 5.71 L

## 2023-04-02 NOTE — Progress Notes (Signed)
Full PFT performed today. °

## 2023-04-02 NOTE — Patient Instructions (Signed)
Full PFT performed today. °

## 2023-04-09 ENCOUNTER — Telehealth: Payer: Self-pay | Admitting: Pulmonary Disease

## 2023-04-09 NOTE — Telephone Encounter (Signed)
PET was on 03/30/2023.

## 2023-04-09 NOTE — Telephone Encounter (Signed)
Patient would like results of PET & CT scan. No available appointments until November 2024. Patient phone number is [phone].

## 2023-04-11 ENCOUNTER — Telehealth: Payer: Self-pay | Admitting: Adult Health

## 2023-04-11 ENCOUNTER — Ambulatory Visit: Payer: BC Managed Care – PPO | Admitting: Primary Care

## 2023-04-11 NOTE — Telephone Encounter (Signed)
Appt with TP for 04/16/23 to review the results

## 2023-04-11 NOTE — Telephone Encounter (Signed)
Pt needs to get a referral sent to oncology Dr. Debara Pickett  Ph: [phone]

## 2023-04-12 NOTE — Progress Notes (Signed)
Patient has appt with Tammy to discuss PET results on 04/16/2023  Thanks,  BLI  Josephine Igo, DO [facility] 04/12/2023 2:29 PM

## 2023-04-16 ENCOUNTER — Ambulatory Visit: Payer: BC Managed Care – PPO | Admitting: Adult Health

## 2023-04-16 ENCOUNTER — Encounter: Payer: Self-pay | Admitting: Adult Health

## 2023-04-16 VITALS — BP 100/80 | HR 81 | Temp 97.7°F | Ht 64.0 in | Wt 112.8 lb

## 2023-04-16 DIAGNOSIS — R490 Dysphonia: Secondary | ICD-10-CM

## 2023-04-16 DIAGNOSIS — J439 Emphysema, unspecified: Secondary | ICD-10-CM | POA: Diagnosis not present

## 2023-04-16 DIAGNOSIS — R911 Solitary pulmonary nodule: Secondary | ICD-10-CM

## 2023-04-16 DIAGNOSIS — Z720 Tobacco use: Secondary | ICD-10-CM

## 2023-04-16 DIAGNOSIS — F17210 Nicotine dependence, cigarettes, uncomplicated: Secondary | ICD-10-CM

## 2023-04-16 DIAGNOSIS — R948 Abnormal results of function studies of other organs and systems: Secondary | ICD-10-CM

## 2023-04-16 MED ORDER — SPIRIVA RESPIMAT 2.5 MCG/ACT IN AERS: 2.0000 | INHALATION_SPRAY | Freq: Every day | RESPIRATORY_TRACT | Status: DC

## 2023-04-16 MED ORDER — ALBUTEROL SULFATE HFA 108 (90 BASE) MCG/ACT IN AERS
1.0000 | INHALATION_SPRAY | Freq: Four times a day (QID) | RESPIRATORY_TRACT | 2 refills | Status: AC | PRN
Start: 2023-04-16 — End: ?

## 2023-04-16 MED ORDER — SPIRIVA RESPIMAT 2.5 MCG/ACT IN AERS: 2.0000 | INHALATION_SPRAY | Freq: Every day | RESPIRATORY_TRACT | 5 refills | Status: DC

## 2023-04-16 NOTE — Assessment & Plan Note (Signed)
Hypermetabolic right upper lobe lung nodule-we reviewed her CT chest and PET scan results in detail.  Right upper lobe lesion is highly suspicious for underlying malignancy.  We went over potential treatment plans including navigational bronchoscopy with biopsy and surgical resection.  PFTs show moderate COPD and diffusing capacity is greater than 50%.  She is fully independent working full-time.  Not oxygen dependent.  PET scan showed no distant disease.  We discussed referral to thoracic surgery for consideration of possible resection.  She has an upcoming appointment with medical oncology later this week.  If patient decides to not proceed with surgical resection we can set her up for navigational bronchoscopy with tissue sampling.

## 2023-04-16 NOTE — Progress Notes (Signed)
Patient seen in the office today and instructed on use of Spiriva.  Patient expressed understanding and demonstrated technique. 

## 2023-04-16 NOTE — Telephone Encounter (Signed)
Pt seen in clinic today by TP. Nothing further needed.

## 2023-04-16 NOTE — Assessment & Plan Note (Signed)
COPD with emphysema-Gold B, stage II COPD-patient has moderate airflow obstruction on PFT.  She has minimum symptom burden.  Has some intermittent cough and shortness of breath.  She is fully independent.  For now we will place on Spiriva 1 puff daily.  Encouraged her on smoking cessation.  Plan  Patient Instructions  Refer to Thoracic surgery . Follow up with Dr. Myna Hidalgo this week as planned  Work on not smoking  Begin Spiriva 2 puffs daily  Albuterol inhaler As needed   Refer to ENT -Hoarseness, abnormal tonsil on PET  Refer to GI -abnormal colon on PET  Follow up in 6-8 weeks with Dr. Tonia Brooms or Shalaunda Weatherholtz NP and As needed    n

## 2023-04-16 NOTE — Assessment & Plan Note (Addendum)
Incidental finding on PET scan with hypermetabolic activity in sigmoid colon.  Questionable etiology.  Patient has never had a colonoscopy.  She is also had associated weight loss and intermittent GI symptoms of anorexia and vomiting.  Will refer to GI for further evaluation and consideration of colonoscopy as indicated  Incidental finding on PET scan with hypermetabolic activity in the right glossotonsillar sulcus questionable etiology.  Patient has associated hoarseness, weight loss, is an active smoker.  Will refer to ENT for further evaluation.  Has a history of cleft lip and palate repair.

## 2023-04-16 NOTE — Addendum Note (Signed)
Addended by: Delrae Rend on: 04/16/2023 02:31 PM   Modules accepted: Orders

## 2023-04-16 NOTE — Addendum Note (Signed)
Addended by: Delrae Rend on: 04/16/2023 04:09 PM   Modules accepted: Orders

## 2023-04-16 NOTE — Patient Instructions (Addendum)
Refer to Thoracic surgery . Follow up with Dr. Myna Hidalgo this week as planned  Work on not smoking  Begin Spiriva 2 puffs daily  Albuterol inhaler As needed   Refer to ENT -Hoarseness, abnormal tonsil on PET  Refer to GI -abnormal colon on PET  Follow up in 6-8 weeks with Dr. Tonia Brooms or Kyndra Condron NP and As needed

## 2023-04-16 NOTE — Assessment & Plan Note (Signed)
Smoking cessation discussed in detail 

## 2023-04-16 NOTE — Progress Notes (Signed)
@Patient  ID: Alexis Davila, female    DOB: 08/01/1960, 62 y.o.   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   TEST/EVENTS :  CT chest March 08, 2023 large aggressive pulmonary nodule in the apex of the right upper lobe measuring 17.4 mm (mean diameter), mild emphysema, no pathologically enlarged lymph nodes  PET scan March 30, 2023 showed a hypermetabolic 11 mm right upper lobe pulmonary nodule with a max SUV at 9.2, no evidence of hypermetabolic metastatic disease Incidental finding of asymmetric hypermetabolic activity around right glossotonsillar sulcus and short segment sigmoid colon FDG avidity  04/16/2023 Follow up : Hypermetabolic RUL nodule, Emphysema, PET results Patient returns for 1 month follow-up.  Patient was seen last visit for a pulmonary consult on September 11.  Patient had a lung cancer CT chest done on March 08, 2023 that showed a right upper lobe nodule measuring 17.4 mm with mean diameter that was suspicious.  Patient is an active smoker.  She was set up for a PET scan that was completed on September 20 that showed a hypermetabolic right upper lobe nodule 11 mm.  With an SUV max at 9.2 there was no evidence of hypermetabolic metastatic disease.  There were some incidental findings as above with asymmetric hypermetabolic activity in the right glossotonsillar sulcus and also in the short segment of the sigmoid.  Patient said prior to her visit last month.  She was having weight loss.  And also had some intermittent vomiting and decreased appetite.  She has chronic hoarseness which she feels like has been present most of her life after her cleft palate surgery.  Patient has never had a  colonoscopy.  We went over her PET scan results in detail. Patient was set up for PFTs that were done on April 02, 2023 this showed moderate airflow obstruction with an FEV1 at 77%, ratio 67, FVC 88%, no significant bronchodilator response, diffusing capacity decreased at 57%.  Total lung capacity 108%. Patient says she is fully independent.  She works full-time.  Does all of her house hold chores.  Has a mild intermittent cough.  Denies any significant shortness of breath and feels that she is active for her age.  She does continue to smoke a half a pack of cigarettes.  She has cut back down from 1 pack of cigarettes daily.  We had a long discussion regarding smoking cessation.  She denies any hemoptysis, chest pain, abdominal pain calf pain or swelling.  No Known Allergies   There is no immunization history on file for this patient.  Past Medical History:  Diagnosis Date   Tobacco use     Tobacco History: Social History   Tobacco Use  Smoking Status Every Day   Current packs/day: 1.00   Types: Cigarettes  Smokeless Tobacco Never  Tobacco Comments   Smokes 1/2 ppd.  Trying to quit.  Hfb  04/16/2023   Ready to quit: Yes Counseling given: Yes Tobacco comments: Smokes 1/2 ppd.  Trying to quit.  Hfb  04/16/2023   Outpatient Medications Prior to Visit  Medication Sig Dispense Refill   clonazePAM (KLONOPIN) 0.5 MG tablet Take 0.5 mg by mouth 2 (two) times daily as needed.  escitalopram (LEXAPRO) 20 MG tablet Take 30 mg by mouth daily.     pantoprazole (PROTONIX) 40 MG tablet Take 40 mg by mouth daily.     promethazine (PHENERGAN) 25 MG tablet Take 12.5 mg by mouth every 8 (eight) hours as needed.     ondansetron (ZOFRAN-ODT) 4 MG disintegrating tablet Take 4 mg by mouth 3 (three) times daily as needed.     No facility-administered medications prior to visit.     Review of Systems:   Constitutional:   No  night sweats,  Fevers, chills, fatigue, or  lassitude.  HEENT:   No  headaches,  Difficulty swallowing,  Tooth/dental problems, or  Sore throat,                No sneezing, itching, ear ache, nasal congestion, post nasal drip,   CV:  No chest pain,  Orthopnea, PND, swelling in lower extremities, anasarca, dizziness, palpitations, syncope.   GI  No heartburn, indigestion, abdominal pain,, diarrhea, change in bowel habits, loss of appetite, bloody stools.   Resp: No shortness of breath with exertion or at rest.  No excess mucus, no productive cough,  No non-productive cough,  No coughing up of blood.  No change in color of mucus.  No wheezing.  No chest wall deformity  Skin: no rash or lesions.  GU: no dysuria, change in color of urine, no urgency or frequency.  No flank pain, no hematuria   MS:  No joint pain or swelling.  No decreased range of motion.  No back pain.    Physical Exam  BP 100/80 (BP Location: Left Arm, Patient Position: Sitting, Cuff Size: Normal)   Pulse 81   Temp 97.7 F (36.5 C) (Oral)   Ht 5\' 4"  (1.626 m)   Wt 112 lb 12.8 oz (51.2 kg) Comment: 112.8lb  SpO2 100%   BMI 19.36 kg/m   GEN: A/Ox3; pleasant , NAD, thin female   HEENT:  [place]/AT,  EACs-clear, TMs-wnl, NOSE-clear, THROAT-clear, no lesions, no postnasal drip or exudate noted. Cleft Lip/palate repair -well healed surgical scar along lip   NECK:  Supple w/ fair ROM; no JVD; normal carotid impulses w/o bruits; no thyromegaly or nodules palpated; no lymphadenopathy.    RESP  Clear  P & A; w/o, wheezes/ rales/ or rhonchi. no accessory muscle use, no dullness to percussion  CARD:  RRR, no m/r/g, no peripheral edema, pulses intact, no cyanosis or clubbing.  GI:   Soft & nt; nml bowel sounds; no organomegaly or masses detected.   Musco: Warm bil, no deformities or joint swelling noted.   Neuro: alert, no focal deficits noted.    Skin: Warm, no lesions or rashes    Lab Results:  CBC No results found for: "WBC", "RBC", "HGB", "HCT", "PLT", "MCV", "MCH", "MCHC", "RDW",  "LYMPHSABS", "MONOABS", "EOSABS", "BASOSABS"  BMET No results found for: "NA", "K", "CL", "CO2", "GLUCOSE", "BUN", "CREATININE", "CALCIUM", "GFRNONAA", "GFRAA"  BNP No results found for: "BNP"  ProBNP No results found for: "PROBNP"  Imaging: NM PET Image Initial (PI) Skull Base To Thigh (F-18 FDG)  Result Date: 04/01/2023 CLINICAL DATA:  Initial treatment strategy for pulmonary nodule. EXAM: NUCLEAR MEDICINE PET SKULL BASE TO THIGH TECHNIQUE: 4.47 mCi F-18 FDG was injected intravenously. Full-ring PET imaging was performed from the skull base to thigh after the radiotracer. CT data was obtained and used for attenuation correction and anatomic localization. Fasting blood glucose: 101 mg/dl COMPARISON:  Chest CT March 08, 2023 FINDINGS: Mediastinal blood  pool activity: SUV max 1.82 Liver activity: SUV max NA NECK: No hypermetabolic cervical adenopathy. Asymmetric hypermetabolic activity along the right glossotonsillar sulcus on axial fused image 30 with a max SUV of 4.2. Incidental CT findings: None. CHEST: Hypermetabolic 11 mm right upper lobe pulmonary nodule on image 55/4 with a max SUV of 9.2. No hypermetabolic thoracic adenopathy. Minimal low-level FDG avidity in prominent axillary lymph nodes are favored reactive. Incidental CT findings: Mild emphysema. Scattered aortic atherosclerosis. ABDOMEN/PELVIS: No abnormal hypermetabolic activity within the liver, pancreas, adrenal glands, or spleen. No hypermetabolic lymph nodes in the abdomen or pelvis. Short segment sigmoid colonic FDG avidity max SUV of 8.0 and another short segment with a max SUV of 7.3. Incidental CT findings: Aortic atherosclerosis. Colonic diverticulosis. Normal appendix. SKELETON: No focal hypermetabolic activity to suggest skeletal metastasis. Incidental CT findings: None. IMPRESSION: 1. Hypermetabolic 11 mm right upper lobe pulmonary nodule, compatible with primary bronchogenic carcinoma. 2. No evidence of hypermetabolic  metastatic disease. 3. Asymmetric hypermetabolic activity along the right glossotonsillar sulcus nonspecific suggest correlation with direct visualization. 4. Short segment sigmoid colonic FDG avidity, recommend correlation with colonoscopy to exclude underlying neoplasm. 5. Aortic Atherosclerosis (ICD10-I70.0) and Emphysema (ICD10-J43.9). Electronically Signed   By: Maudry Mayhew M.D.   On: 04/01/2023 12:20    Administration History     None          Latest Ref Rng & Units 04/02/2023    3:35 PM  PFT Results  FVC-Pre L 3.08  P  FVC-Predicted Pre % 88  P  FVC-Post L 3.11  P  FVC-Predicted Post % 89  P  Pre FEV1/FVC % % 67  P  Post FEV1/FCV % % 65  P  FEV1-Pre L 2.08  P  FEV1-Predicted Pre % 77  P  FEV1-Post L 2.03  P  DLCO uncorrected ml/min/mmHg 12.20  P  DLCO UNC% % 57  P  DLCO corrected ml/min/mmHg 12.20  P  DLCO COR %Predicted % 57  P  DLVA Predicted % 58  P  TLC L 5.71  P  TLC % Predicted % 108  P  RV % Predicted % 128  P    P Preliminary result    No results found for: "NITRICOXIDE"      Assessment & Plan:   Lung nodule Hypermetabolic right upper lobe lung nodule-we reviewed her CT chest and PET scan results in detail.  Right upper lobe lesion is highly suspicious for underlying malignancy.  We went over potential treatment plans including navigational bronchoscopy with biopsy and surgical resection.  PFTs show moderate COPD and diffusing capacity is greater than 50%.  She is fully independent working full-time.  Not oxygen dependent.  PET scan showed no distant disease.  We discussed referral to thoracic surgery for consideration of possible resection.  She has an upcoming appointment with medical oncology later this week.  If patient decides to not proceed with surgical resection we can set her up for navigational bronchoscopy with tissue sampling.  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   She has minimum symptom burden.  Has some intermittent cough and shortness of breath.  She is fully independent.  For now we will place on Spiriva 1 puff daily.  Encouraged her on smoking cessation.  Plan  Patient Instructions  Refer to Thoracic surgery . Follow up with Dr. Myna Hidalgo this week as planned  Work on not smoking  Begin Spiriva 2 puffs daily  Albuterol inhaler As needed   Refer to ENT -Hoarseness, abnormal tonsil on PET  Refer to GI -abnormal colon on PET  Follow up in 6-8 weeks with Dr. Tonia Brooms or Shandricka Monroy NP and As needed    n   Abnormal positron emission tomography (PET) scan Incidental finding on PET scan with hypermetabolic activity in sigmoid colon.  Questionable etiology.  Patient has never had a colonoscopy.  She is also had associated weight loss and intermittent GI symptoms of anorexia and vomiting.  Will refer to GI for further evaluation and consideration of colonoscopy as indicated  Incidental finding on PET scan with hypermetabolic activity in the right glossotonsillar sulcus questionable etiology.  Patient has associated hoarseness, weight loss, is an active smoker.  Will refer to ENT for further evaluation.  Has a history of cleft lip and palate repair.  Tobacco abuse Smoking cessation discussed in detail    I spent  50  minutes dedicated to the care of this patient on the date of this encounter to include pre-visit review of records, face-to-face time with the patient discussing conditions above, post visit ordering of testing, clinical documentation with the electronic health record, making appropriate referrals as documented, and communicating necessary findings to members of the patients care team.   Rubye Oaks, NP 04/16/2023

## 2023-04-20 ENCOUNTER — Encounter: Payer: Self-pay | Admitting: Hematology & Oncology

## 2023-04-20 ENCOUNTER — Inpatient Hospital Stay: Payer: BC Managed Care – PPO | Attending: Hematology & Oncology

## 2023-04-20 ENCOUNTER — Telehealth: Payer: Self-pay | Admitting: *Deleted

## 2023-04-20 ENCOUNTER — Inpatient Hospital Stay: Payer: BC Managed Care – PPO | Admitting: Hematology & Oncology

## 2023-04-20 VITALS — BP 116/54 | HR 64 | Temp 97.5°F | Resp 20 | Ht 64.0 in | Wt 113.1 lb

## 2023-04-20 DIAGNOSIS — Z79899 Other long term (current) drug therapy: Secondary | ICD-10-CM | POA: Insufficient documentation

## 2023-04-20 DIAGNOSIS — F1290 Cannabis use, unspecified, uncomplicated: Secondary | ICD-10-CM | POA: Insufficient documentation

## 2023-04-20 DIAGNOSIS — Z1211 Encounter for screening for malignant neoplasm of colon: Secondary | ICD-10-CM

## 2023-04-20 DIAGNOSIS — R911 Solitary pulmonary nodule: Secondary | ICD-10-CM | POA: Diagnosis not present

## 2023-04-20 DIAGNOSIS — F17210 Nicotine dependence, cigarettes, uncomplicated: Secondary | ICD-10-CM

## 2023-04-20 MED ORDER — NA SULFATE-K SULFATE-MG SULF 17.5-3.13-1.6 GM/177ML PO SOLN: ORAL | 0 refills | Status: DC

## 2023-04-20 NOTE — Telephone Encounter (Signed)
I have spoken to patient and she is agreeable to colonoscopy with Dr Adela Lank on 04/24/23. Patient has been advised of time/date/location of upcoming procedure and has been given verbal prep instructions. She has also been advised that she will need a care partner 18 years or older to bring her, stay for the procedure and drive here home due to sedation. Written instructions have also been made available to the patient for additional review in mychart.  No previsit appointments open at this time. Patient is not allergic to soy, no previous hx of difficult intubation or sedation issues, no known family history of malignant hyperthermia, no anticoagulation, no supplemental oxygen.

## 2023-04-20 NOTE — Telephone Encounter (Signed)
-----   Message from Benancio Deeds sent at 04/20/2023  1:02 PM EDT ----- Sure thing feel free to use it. Dottie I sent you another message earlier today about a patient for using that slot, I'm not sure if that patient can do it or if you had even spoken with them, but can place that patient to my next available opening to accommodate this case if more urgent. If the slot has been filled by her then let me know, we can sort it out. Thanks  Brett Canales ----- Message ----- From: Shellia Cleverly, DO Sent: 04/20/2023  12:52 PM EDT To: Richardson Chiquito, RN; Benancio Deeds, MD  Received a call from Dr. Myna Hidalgo in the Oncology Clinic.  62 year old patient with newly diagnosed lung cancer and recent PET/CT showing short segment sigmoid colon FDG avidity with SUV max 8.0 and another short segment with SUV max 7.3.  No previous colonoscopy.  Patient needs colonoscopy now to rule out additional malignancy prior to treatment of lung cancer.  Brett Canales, I do not have any availability for the next month or so and I am inpatient next week.  You have 1 spot on 04/24/2023.  If the patient can make it, would you be ok with getting her in for expedited colonoscopy without OV first?    Otherwise, Dottie, I could put her on with me in a 730 spot for the following week after inpatient.  VC

## 2023-04-20 NOTE — Progress Notes (Signed)
Referral MD  Reason for Referral: Probable bronchogenic carcinoma-right upper lung  Chief Complaint  Patient presents with   New Patient (Initial Visit)    Right lung mass.  : The doctors think I have lung cancer.  HPI: Alexis Davila is a very charming 62 year old white female.  She comes in with her daughter.  She is incredibly delightful to talk to.  She works at [employer].  She has been there for 20 years.  She has extensive history of tobacco use.  She has history of underlying COPD.  She smokes probably a pack per day.  She has at least a 70 pack history of tobacco use.  She has been getting I think screening CT scans.  She had 1 done on 03/08/2023.  It showed a nodule in the right upper lobe.  This measured 17.4 mm.  There is no pathologically enlarged lymph nodes.  She had emphysematous changes.  She subsequently had a PET scan that was done on 03/30/2023.  This showed a metabolic nodule in the right upper lobe.  Had an SUV of 9.2.  There is no evidence of disease elsewhere.  However, the radiologist commented on some asymmetric activity around the glossotonsillar sulcus and sigmoid colon..  She has never had a colonoscopy.  She has seen Pulmonary Medicine.  She was referred to [place] for further evaluation.  She is eating well.  She has lost little bit of weight.  She has had no hemoptysis.  She has had no cough that is different than a little bit of a chronic cough that she has..  She has had no obvious change in bowel or bladder habits.  She has had no melena or hematochezia.  She has had no rashes.  She has had no leg swelling.  She has had no fever.  There has been no problems with COVID.  I think if she had COVID, it was several years ago.  Currently, I would have said that her performance status is probably ECOG 1.    Past Medical History:  Diagnosis Date   Tobacco use   :   Past Surgical History:  Procedure Laterality Date   BACK SURGERY     CLEFT PALATE REPAIR     TUBAL  LIGATION    :   Current Outpatient Medications:    clonazePAM (KLONOPIN) 0.5 MG tablet, Take 0.5 mg by mouth 2 (two) times daily as needed., Disp: , Rfl:    escitalopram (LEXAPRO) 20 MG tablet, Take 30 mg by mouth daily., Disp: , Rfl:    pantoprazole (PROTONIX) 40 MG tablet, Take 40 mg by mouth daily., Disp: , Rfl:    promethazine (PHENERGAN) 25 MG tablet, Take 12.5 mg by mouth every 8 (eight) hours as needed., Disp: , Rfl:    Tiotropium Bromide Monohydrate (SPIRIVA RESPIMAT) 2.5 MCG/ACT AERS, Inhale 2 puffs into the lungs daily., Disp: 1 g, Rfl: 5   albuterol (VENTOLIN HFA) 108 (90 Base) MCG/ACT inhaler, Inhale 1-2 puffs into the lungs every 6 (six) hours as needed. (Patient not taking: Reported on 04/20/2023), Disp: 8 g, Rfl: 2   Na Sulfate-K Sulfate-Mg Sulf 17.5-3.13-1.6 GM/177ML SOLN, Use as directed; may use generic; goodrx card if insurance will not cover generic, Disp: 354 mL, Rfl: 0:  :  No Known Allergies:   Family History  Problem Relation Age of Onset   CAD Mother        CABG at age 69   Breast cancer Neg Hx   :  Social History   Socioeconomic History   Marital status: Married    Spouse name: Not on file   Number of children: 2   Years of education: Not on file   Highest education level: Not on file  Occupational History    Comment: Aeronautical engineer  Tobacco Use   Smoking status: Every Day    Current packs/day: 1.00    Types: Cigarettes   Smokeless tobacco: Never   Tobacco comments:    Smokes 1/2 ppd.  Trying to quit.  Hfb  04/16/2023  Vaping Use   Vaping status: Not on file  Substance and Sexual Activity   Alcohol use: No   Drug use: Yes    Types: Marijuana   Sexual activity: Yes  Other Topics Concern   Not on file  Social History Narrative   Not on file   Social Determinants of Health   Financial Resource Strain: Low Risk  (04/20/2023)   Overall Financial Resource Strain (CARDIA)    Difficulty of Paying Living Expenses: Not hard at all   Food Insecurity: No Food Insecurity (04/20/2023)   Hunger Vital Sign    Worried About Running Out of Food in the Last Year: Never true    Ran Out of Food in the Last Year: Never true  Transportation Needs: No Transportation Needs (04/20/2023)   PRAPARE - Administrator, Civil Service (Medical): No    Lack of Transportation (Non-Medical): No  Physical Activity: Sufficiently Active (04/20/2023)   Exercise Vital Sign    Days of Exercise per Week: 5 days    Minutes of Exercise per Session: 40 min  Stress: Stress Concern Present (04/20/2023)   [employer] of Occupational Health - Occupational Stress Questionnaire    Feeling of Stress : To some extent  Social Connections: Moderately Isolated (04/20/2023)   Social Connection and Isolation Panel [NHANES]    Frequency of Communication with Friends and Family: More than three times a week    Frequency of Social Gatherings with Friends and Family: More than three times a week    Attends Religious Services: Never    Database administrator or Organizations: No    Attends Banker Meetings: Never    Marital Status: Married  Catering manager Violence: Not At Risk (04/20/2023)   Humiliation, Afraid, Rape, and Kick questionnaire    Fear of Current or Ex-Partner: No    Emotionally Abused: No    Physically Abused: No    Sexually Abused: No  : Review of Systems  Constitutional:  Positive for weight loss.  HENT: Negative.    Eyes: Negative.   Respiratory:  Positive for cough.   Cardiovascular: Negative.   Gastrointestinal: Negative.   Genitourinary: Negative.   Musculoskeletal: Negative.   Skin: Negative.   Neurological: Negative.   Endo/Heme/Allergies: Negative.   Psychiatric/Behavioral: Negative.       Exam: Vital signs are temperature of 97.5.  Pulse 64.  Blood pressure 116/54.  Weight is 113 pounds.  @IPVITALS @ Physical Exam Vitals reviewed.  HENT:     Head: Normocephalic and atraumatic.  Eyes:      Pupils: Pupils are equal, round, and reactive to light.  Cardiovascular:     Rate and Rhythm: Normal rate and regular rhythm.     Heart sounds: Normal heart sounds.  Pulmonary:     Effort: Pulmonary effort is normal.     Breath sounds: Normal breath sounds.  Abdominal:     General: Bowel sounds are normal.  Palpations: Abdomen is soft.  Musculoskeletal:        General: No tenderness or deformity. Normal range of motion.     Cervical back: Normal range of motion.  Lymphadenopathy:     Cervical: No cervical adenopathy.  Skin:    General: Skin is warm and dry.     Findings: No erythema or rash.  Neurological:     Mental Status: She is alert and oriented to person, place, and time.  Psychiatric:        Behavior: Behavior normal.        Thought Content: Thought content normal.        Judgment: Judgment normal.      No results for input(s): "WBC", "HGB", "HCT", "PLT" in the last 72 hours. No results for input(s): "NA", "K", "CL", "CO2", "GLUCOSE", "BUN", "CREATININE", "CALCIUM" in the last 72 hours.  Blood smear review: None  Pathology: None    Assessment and Plan: Alexis Davila is a very nice 62 year old white female.  She has history of tobacco use.  She has a nodule in the right upper lung.  It is active on PET scan.  Given her history of tobacco use, have to believe that this is going to be a bronchogenic carcinoma.  My recommendation is to have this taken out.  I think this could be done laparoscopically.  She must go to [facility].  Will have to see about making her referral to Dr. Leonia Corona.  Unfortunately, she was noted to have some other abnormalities on PET scan.  I have already spoke with Dr. Barron Alvine of Gastroenterology.  He will set up with a colonoscopy.  Will have to call Dr. Jenne Pane of ENT about a laryngoscopy maybe or some evaluation for this abnormality in the right glossotonsillar sulcus.  I again, I cannot imagine that this is anything malignant.  By the  PET scan, it certainly looks like this is a stage I malignancy.  I did not do not see that we have do any other tests right now.  I cannot imagine that she has anything with respect to the CNS.  I see nothing on exam that would suggest CNS disease.  We will likely plan to see her back when she has surgery.  Again, I would suspect that she we will have surgery, hopefully within a month.

## 2023-04-23 ENCOUNTER — Encounter (INDEPENDENT_AMBULATORY_CARE_PROVIDER_SITE_OTHER): Payer: Self-pay | Admitting: Otolaryngology

## 2023-04-24 ENCOUNTER — Ambulatory Visit: Payer: BC Managed Care – PPO | Admitting: Gastroenterology

## 2023-04-24 ENCOUNTER — Encounter: Payer: Self-pay | Admitting: *Deleted

## 2023-04-24 ENCOUNTER — Encounter: Payer: Self-pay | Admitting: Gastroenterology

## 2023-04-24 VITALS — BP 133/67 | HR 71 | Temp 98.8°F | Resp 15 | Ht 64.0 in | Wt 113.0 lb

## 2023-04-24 DIAGNOSIS — D128 Benign neoplasm of rectum: Secondary | ICD-10-CM

## 2023-04-24 DIAGNOSIS — D123 Benign neoplasm of transverse colon: Secondary | ICD-10-CM | POA: Diagnosis not present

## 2023-04-24 DIAGNOSIS — Z1211 Encounter for screening for malignant neoplasm of colon: Secondary | ICD-10-CM | POA: Diagnosis not present

## 2023-04-24 DIAGNOSIS — K635 Polyp of colon: Secondary | ICD-10-CM | POA: Diagnosis not present

## 2023-04-24 DIAGNOSIS — R911 Solitary pulmonary nodule: Secondary | ICD-10-CM

## 2023-04-24 DIAGNOSIS — D122 Benign neoplasm of ascending colon: Secondary | ICD-10-CM | POA: Diagnosis not present

## 2023-04-24 MED ORDER — SODIUM CHLORIDE 0.9 % IV SOLN: 500.0000 mL | Freq: Once | INTRAVENOUS | Status: DC

## 2023-04-24 NOTE — Progress Notes (Signed)
Called to room to assist during endoscopic procedure.  Patient ID and intended procedure confirmed with present staff. Received instructions for my participation in the procedure from the performing physician.  

## 2023-04-24 NOTE — Patient Instructions (Signed)
Please read handouts provided. Continue present medications. Await pathology results.   YOU HAD AN ENDOSCOPIC PROCEDURE TODAY AT THE [facility]:   Refer to the procedure report that was given to you for any specific questions about what was found during the examination.  If the procedure report does not answer your questions, please call your gastroenterologist to clarify.  If you requested that your care partner not be given the details of your procedure findings, then the procedure report has been included in a sealed envelope for you to review at your convenience later.  YOU SHOULD EXPECT: Some feelings of bloating in the abdomen. Passage of more gas than usual.  Walking can help get rid of the air that was put into your GI tract during the procedure and reduce the bloating. If you had a lower endoscopy (such as a colonoscopy or flexible sigmoidoscopy) you may notice spotting of blood in your stool or on the toilet paper. If you underwent a bowel prep for your procedure, you may not have a normal bowel movement for a few days.  Please Note:  You might notice some irritation and congestion in your nose or some drainage.  This is from the oxygen used during your procedure.  There is no need for concern and it should clear up in a day or so.  SYMPTOMS TO REPORT IMMEDIATELY:  Following lower endoscopy (colonoscopy or flexible sigmoidoscopy):  Excessive amounts of blood in the stool  Significant tenderness or worsening of abdominal pains  Swelling of the abdomen that is new, acute  Fever of 100F or higher  For urgent or emergent issues, a gastroenterologist can be reached at any hour by calling [phone]. Do not use MyChart messaging for urgent concerns.    DIET:  We do recommend a small meal at first, but then you may proceed to your regular diet.  Drink plenty of fluids but you should avoid alcoholic beverages for 24 hours.  ACTIVITY:  You should plan to take it easy for  the rest of today and you should NOT DRIVE or use heavy machinery until tomorrow (because of the sedation medicines used during the test).    FOLLOW UP: Our staff will call the number listed on your records the next business day following your procedure.  We will call around 7:15- 8:00 am to check on you and address any questions or concerns that you may have regarding the information given to you following your procedure. If we do not reach you, we will leave a message.     If any biopsies were taken you will be contacted by phone or by letter within the next 1-3 weeks.  Please call us at [phone] if you have not heard about the biopsies in 3 weeks.    SIGNATURES/CONFIDENTIALITY: You and/or your care partner have signed paperwork which will be entered into your electronic medical record.  These signatures attest to the fact that that the information above on your After Visit Summary has been reviewed and is understood.  Full responsibility of the confidentiality of this discharge information lies with you and/or your care-partner. 

## 2023-04-24 NOTE — Progress Notes (Signed)
Sedate, gd SR, tolerated procedure well, VSS, report to RN 

## 2023-04-24 NOTE — Progress Notes (Signed)
[facility] History and Physical   Primary Care Physician:  Milus Height, [place]   Reason for Procedure:   Family history of colon cancer  Plan:    colonoscopy     HPI: Alexis Davila is a 62 y.o. female  here for colonoscopy screening -    . Patient denies any bowel symptoms at this time. Her father had a colon surgery she is not sure why - no definitive colon cancer in the family she is aware of. First time exam. Otherwise feels well without any cardiopulmonary symptoms.   I have discussed risks / benefits of anesthesia and endoscopic procedure with [name] and they wish to proceed with the exams as outlined today.    Past Medical History:  Diagnosis Date   Anxiety    Depression    Emphysema of lung (HCC)    GERD (gastroesophageal reflux disease)    Lung cancer (HCC)    Osteoporosis    Tobacco use     Past Surgical History:  Procedure Laterality Date   BACK SURGERY     CLEFT PALATE REPAIR     HERNIA REPAIR     TUBAL LIGATION      Prior to Admission medications   Medication Sig Start Date End Date Taking? Authorizing Provider  albuterol (VENTOLIN HFA) 108 (90 Base) MCG/ACT inhaler Inhale 1-2 puffs into the lungs every 6 (six) hours as needed. 04/16/23  Yes Parrett, Tammy S, NP  clonazePAM (KLONOPIN) 0.5 MG tablet Take 0.5 mg by mouth 2 (two) times daily as needed. 03/23/23  Yes [provider]  escitalopram (LEXAPRO) 20 MG tablet Take 30 mg by mouth daily.   Yes [provider]  pantoprazole (PROTONIX) 40 MG tablet Take 40 mg by mouth daily. 02/16/23  Yes [provider]  promethazine (PHENERGAN) 25 MG tablet Take 12.5 mg by mouth every 8 (eight) hours as needed. 03/09/23  Yes [provider]  Tiotropium Bromide Monohydrate (SPIRIVA RESPIMAT) 2.5 MCG/ACT AERS Inhale 2 puffs into the lungs daily. 04/16/23  Yes Parrett, Virgel Bouquet, NP    Current Outpatient Medications  Medication Sig Dispense Refill   albuterol  (VENTOLIN HFA) 108 (90 Base) MCG/ACT inhaler Inhale 1-2 puffs into the lungs every 6 (six) hours as needed. 8 g 2   clonazePAM (KLONOPIN) 0.5 MG tablet Take 0.5 mg by mouth 2 (two) times daily as needed.     escitalopram (LEXAPRO) 20 MG tablet Take 30 mg by mouth daily.     pantoprazole (PROTONIX) 40 MG tablet Take 40 mg by mouth daily.     promethazine (PHENERGAN) 25 MG tablet Take 12.5 mg by mouth every 8 (eight) hours as needed.     Tiotropium Bromide Monohydrate (SPIRIVA RESPIMAT) 2.5 MCG/ACT AERS Inhale 2 puffs into the lungs daily. 1 g 5   Current Facility-Administered Medications  Medication Dose Route Frequency Provider Last Rate Last Admin   0.9 %  sodium chloride infusion  500 mL Intravenous Once Kolina Kube, Willaim Rayas, MD        Allergies as of 04/24/2023   (No Known Allergies)    Family History  Problem Relation Age of Onset   CAD Mother        CABG at age 38   Kidney cancer Mother    Kidney failure Mother    Hypertension Mother    Heart attack Mother    Diabetes Father    Colon cancer Father    Hypertension Father    Multiple sclerosis  Maternal Grandmother    Heart attack Paternal Grandfather    Breast cancer Neg Hx    Esophageal cancer Neg Hx    Rectal cancer Neg Hx    Stomach cancer Neg Hx     Social History   Socioeconomic History   Marital status: Married    Spouse name: Not on file   Number of children: 2   Years of education: Not on file   Highest education level: Not on file  Occupational History    Comment: Aeronautical engineer  Tobacco Use   Smoking status: Every Day    Current packs/day: 1.00    Types: Cigarettes   Smokeless tobacco: Never   Tobacco comments:    Smokes 1/2 ppd.  Trying to quit.  Hfb  04/16/2023  Vaping Use   Vaping status: Never Used  Substance and Sexual Activity   Alcohol use: No   Drug use: Yes    Types: Marijuana    Comment: 04/22/2023   Sexual activity: Yes  Other Topics Concern   Not on file  Social History  Narrative   Not on file   Social Determinants of Health   Financial Resource Strain: Low Risk  (04/20/2023)   Overall Financial Resource Strain (CARDIA)    Difficulty of Paying Living Expenses: Not hard at all  Food Insecurity: No Food Insecurity (04/20/2023)   Hunger Vital Sign    Worried About Running Out of Food in the Last Year: Never true    Ran Out of Food in the Last Year: Never true  Transportation Needs: No Transportation Needs (04/20/2023)   PRAPARE - Administrator, Civil Service (Medical): No    Lack of Transportation (Non-Medical): No  Physical Activity: Sufficiently Active (04/20/2023)   Exercise Vital Sign    Days of Exercise per Week: 5 days    Minutes of Exercise per Session: 40 min  Stress: Stress Concern Present (04/20/2023)   [employer] of Occupational Health - Occupational Stress Questionnaire    Feeling of Stress : To some extent  Social Connections: Moderately Isolated (04/20/2023)   Social Connection and Isolation Panel [NHANES]    Frequency of Communication with Friends and Family: More than three times a week    Frequency of Social Gatherings with Friends and Family: More than three times a week    Attends Religious Services: Never    Database administrator or Organizations: No    Attends Banker Meetings: Never    Marital Status: Married  Catering manager Violence: Not At Risk (04/20/2023)   Humiliation, Afraid, Rape, and Kick questionnaire    Fear of Current or Ex-Partner: No    Emotionally Abused: No    Physically Abused: No    Sexually Abused: No    Review of Systems: All other review of systems negative except as mentioned in the HPI.  Physical Exam: Vital signs BP (!) 141/98 (BP Location: Right Arm, Patient Position: Sitting, Cuff Size: Normal)   Pulse 93   Temp 98.8 F (37.1 C) (Temporal)   Ht 5\' 4"  (1.626 m)   Wt 113 lb (51.3 kg)   SpO2 100%   BMI 19.40 kg/m   General:   Alert,  Well-developed,  pleasant and cooperative in NAD Lungs:  Clear throughout to auscultation.   Heart:  Regular rate and rhythm Abdomen:  Soft, nontender and nondistended.   Neuro/Psych:  Alert and cooperative. Normal mood and affect. A and O x 3  Harlin Rain, MD  [facility]

## 2023-04-24 NOTE — Op Note (Signed)
[facility] Patient Name: Alexis Davila Procedure Date: 04/24/2023 8:41 AM MRN: [id number] Endoscopist: Viviann Spare P. Adela Lank , MD, [id number] Age: 62 Referring MD:  Date of Birth: 17-Feb-1961 Gender: Female Account #: [id number] Procedure:                Colonoscopy Indications:              Screening for colorectal malignant neoplasm, This                            is the patient's first colonoscopy Medicines:                Monitored Anesthesia Care Procedure:                Pre-Anesthesia Assessment:                           - Prior to the procedure, a History and Physical                            was performed, and patient medications and                            allergies were reviewed. The patient's tolerance of                            previous anesthesia was also reviewed. The risks                            and benefits of the procedure and the sedation                            options and risks were discussed with the patient.                            All questions were answered, and informed consent                            was obtained. Prior Anticoagulants: The patient has                            taken no anticoagulant or antiplatelet agents. ASA                            Grade Assessment: II - A patient with mild systemic                            disease. After reviewing the risks and benefits,                            the patient was deemed in satisfactory condition to                            undergo the procedure.  After obtaining informed consent, the colonoscope                            was passed under direct vision. Throughout the                            procedure, the patient's blood pressure, pulse, and                            oxygen saturations were monitored continuously. The                            Olympus Scope SN [id number] was introduced through the                            anus and  advanced to the the cecum, identified by                            appendiceal orifice and ileocecal valve. The                            colonoscopy was performed without difficulty. The                            patient tolerated the procedure well. The quality                            of the bowel preparation was adequate. The                            ileocecal valve, appendiceal orifice, and rectum                            were photographed. Scope In: 9:01:13 AM Scope Out: 9:26:43 AM Scope Withdrawal Time: 0 hours 21 minutes 23 seconds  Total Procedure Duration: 0 hours 25 minutes 30 seconds  Findings:                 Hemorrhoids were found on perianal exam.                           Many small-mouthed diverticula were found in the                            entire colon. Severe in the left colon with                            angulated turns - ultraslim pediatric colonoscope                            used to complete this exam. The colon was tortous.                           A diminutive polyp was found in the ascending  colon. The polyp was flat. The polyp was removed                            with a cold snare. Resection and retrieval were                            complete.                           A 5 mm polyp was found in the hepatic flexure. The                            polyp was flat. The polyp was removed with a cold                            snare. Resection and retrieval were complete.                           A 3 mm polyp was found in the rectum. The polyp was                            sessile. The polyp was removed with a cold snare.                            Resection and retrieval were complete.                           Internal hemorrhoids were found during retroflexion.                           The exam was otherwise without abnormality. Complications:            No immediate complications. Estimated blood loss:                             Minimal. Estimated Blood Loss:     Estimated blood loss was minimal. Impression:               - Hemorrhoids found on perianal exam.                           - Diverticulosis in the entire examined colon.                            Severe in the left colon                           - One diminutive polyp in the ascending colon,                            removed with a cold snare. Resected and retrieved.                           - One 5 mm polyp at the hepatic flexure, removed  with a cold snare. Resected and retrieved.                           - One 3 mm polyp in the rectum, removed with a cold                            snare. Resected and retrieved.                           - Internal hemorrhoids.                           - The examination was otherwise normal. Recommendation:           - Patient has a contact number available for                            emergencies. The signs and symptoms of potential                            delayed complications were discussed with the                            patient. Return to normal activities tomorrow.                            Written discharge instructions were provided to the                            patient.                           - Resume previous diet.                           - Continue present medications.                           - Await pathology results. Viviann Spare P. Reid Regas, MD 04/24/2023 9:32:16 AM This report has been signed electronically.

## 2023-04-24 NOTE — Progress Notes (Signed)
This Navigator out of the office for New Patient appointment.   Initial RN Navigator Patient Visit  Name: Alexis Davila Date of Referral : 04/16/2023 Diagnosis: LUL Bronchiogenic Cancer  Patient completed visit with Dr. Myna Hidalgo.  She needs a colonoscopy which was completed today without any obvious findings.   She needs to see ENT regarding visual assessment of abnormality in the right glossotonsillar sulcus. Dr Myna Hidalgo has reached out to Dr Jenne Pane.   She will need referral to [facility], Dr Leonia Corona for surgical resection consideration. Dr Myna Hidalgo will reach out.   Will follow up in several days to make sure referrals have been scheduled.   Oncology Nurse Navigator Documentation     04/24/2023   10:30 AM  Oncology Nurse Navigator Flowsheets  Abnormal Finding Date 03/18/2023  Confirmed Diagnosis Date 05/01/2023  Diagnosis Status Additional Work Up  Navigator Follow Up Date: 04/26/2023  Navigator Follow Up Reason: Appointment Review  Navigator Location [facility]  Referral Date to RadOnc/MedOnc 04/16/2023  Navigator Encounter Type Appt/Treatment Plan Review  Patient Visit Type MedOnc  Treatment Phase Pre-Tx/Tx Discussion  Barriers/Navigation Needs Coordination of Care;Education  Interventions Referrals  Acuity Level 2-Minimal Needs (1-2 Barriers Identified)  Referrals Other  Time Spent with Patient 30

## 2023-04-25 ENCOUNTER — Ambulatory Visit: Payer: BC Managed Care – PPO | Admitting: Pulmonary Disease

## 2023-04-25 ENCOUNTER — Telehealth: Payer: Self-pay

## 2023-04-25 NOTE — Telephone Encounter (Signed)
  Follow up Call-     04/24/2023    8:24 AM 04/24/2023    8:19 AM  Call back number  Post procedure Call Back phone  # [phone]   Permission to leave phone message  Yes     Patient questions:  Do you have a fever, pain , or abdominal swelling? No. Pain Score  0 *  Have you tolerated food without any problems? Yes.    Have you been able to return to your normal activities? Yes.    Do you have any questions about your discharge instructions: Diet   No. Medications  No. Follow up visit  No.  Do you have questions or concerns about your Care? No.  Actions: * If pain score is 4 or above: No action needed, pain <4.

## 2023-04-26 ENCOUNTER — Encounter: Payer: Self-pay | Admitting: *Deleted

## 2023-04-26 LAB — SURGICAL PATHOLOGY

## 2023-04-26 NOTE — Progress Notes (Signed)
Confirmed that patient has appropriate referral appointments. She will see Dr Jenne Pane on 05/02/2023 and Dr Ewing Schlein on 05/10/2023.  Oncology Nurse Navigator Documentation     04/26/2023    8:30 AM  Oncology Nurse Navigator Flowsheets  Navigator Follow Up Date: 05/10/2023  Navigator Follow Up Reason: Review Note  Navigator Location [facility]  Navigator Encounter Type Appt/Treatment Plan Review  Patient Visit Type MedOnc  Treatment Phase Pre-Tx/Tx Discussion  Barriers/Navigation Needs Coordination of Care;Education  Interventions None Required  Acuity Level 2-Minimal Needs (1-2 Barriers Identified)  Time Spent with Patient 15

## 2023-05-01 DIAGNOSIS — C3490 Malignant neoplasm of unspecified part of unspecified bronchus or lung: Secondary | ICD-10-CM | POA: Diagnosis not present

## 2023-05-01 DIAGNOSIS — R911 Solitary pulmonary nodule: Secondary | ICD-10-CM | POA: Diagnosis not present

## 2023-05-02 DIAGNOSIS — C3411 Malignant neoplasm of upper lobe, right bronchus or lung: Secondary | ICD-10-CM | POA: Diagnosis not present

## 2023-05-03 ENCOUNTER — Encounter: Payer: BC Managed Care – PPO | Admitting: Thoracic Surgery (Cardiothoracic Vascular Surgery)

## 2023-05-03 ENCOUNTER — Encounter: Payer: Self-pay | Admitting: *Deleted

## 2023-05-03 NOTE — Progress Notes (Signed)
Patient was seen by ENT yesterday. They completed a physical exam that was essentially normal. No need for further follow up.   Patient sees surgeon next week to consult regarding surgery. Will follow.   Oncology Nurse Navigator Documentation     05/03/2023    8:00 AM  Oncology Nurse Navigator Flowsheets  Navigator Follow Up Date: 05/10/2023  Navigator Follow Up Reason: Review Note  Navigator Location [facility]  Navigator Encounter Type Appt/Treatment Plan Review  Patient Visit Type MedOnc  Treatment Phase Pre-Tx/Tx Discussion  Barriers/Navigation Needs Coordination of Care;Education  Interventions None Required  Acuity Level 2-Minimal Needs (1-2 Barriers Identified)  Time Spent with Patient 15

## 2023-05-09 ENCOUNTER — Encounter: Payer: BC Managed Care – PPO | Admitting: Thoracic Surgery (Cardiothoracic Vascular Surgery)

## 2023-05-09 DIAGNOSIS — Z0000 Encounter for general adult medical examination without abnormal findings: Secondary | ICD-10-CM | POA: Diagnosis not present

## 2023-05-09 DIAGNOSIS — J439 Emphysema, unspecified: Secondary | ICD-10-CM | POA: Diagnosis not present

## 2023-05-09 DIAGNOSIS — M7711 Lateral epicondylitis, right elbow: Secondary | ICD-10-CM | POA: Diagnosis not present

## 2023-05-09 DIAGNOSIS — Z23 Encounter for immunization: Secondary | ICD-10-CM | POA: Diagnosis not present

## 2023-05-09 DIAGNOSIS — F430 Acute stress reaction: Secondary | ICD-10-CM | POA: Diagnosis not present

## 2023-05-09 DIAGNOSIS — R7303 Prediabetes: Secondary | ICD-10-CM | POA: Diagnosis not present

## 2023-05-10 DIAGNOSIS — R911 Solitary pulmonary nodule: Secondary | ICD-10-CM | POA: Diagnosis not present

## 2023-05-11 ENCOUNTER — Encounter: Payer: Self-pay | Admitting: *Deleted

## 2023-05-11 NOTE — Progress Notes (Signed)
Patient was seen by Dr Ewing Schlein for consideration of surgical resection. She is appropriate for up front surgical resection for early lung cancer, however she is still an active smoker. Dr Ewing Schlein has provided her resources to help quit. She will return 06/22/2023 for reassessment and possible scheduling of surgery.   Oncology Nurse Navigator Documentation     05/11/2023   12:30 PM  Oncology Nurse Navigator Flowsheets  Navigator Follow Up Date: 06/22/2023  Navigator Follow Up Reason: Review Note  Navigator Location [facility]  Navigator Encounter Type Appt/Treatment Plan Review  Patient Visit Type MedOnc  Treatment Phase Pre-Tx/Tx Discussion  Barriers/Navigation Needs Coordination of Care;Education  Interventions None Required  Acuity Level 2-Minimal Needs (1-2 Barriers Identified)  Time Spent with Patient 15

## 2023-05-24 DIAGNOSIS — F17209 Nicotine dependence, unspecified, with unspecified nicotine-induced disorders: Secondary | ICD-10-CM | POA: Diagnosis not present

## 2023-05-30 DIAGNOSIS — F17209 Nicotine dependence, unspecified, with unspecified nicotine-induced disorders: Secondary | ICD-10-CM | POA: Diagnosis not present

## 2023-05-30 DIAGNOSIS — Z716 Tobacco abuse counseling: Secondary | ICD-10-CM | POA: Diagnosis not present

## 2023-06-11 ENCOUNTER — Ambulatory Visit: Payer: BC Managed Care – PPO | Admitting: Adult Health

## 2023-06-11 ENCOUNTER — Encounter: Payer: Self-pay | Admitting: Adult Health

## 2023-06-13 DIAGNOSIS — F17209 Nicotine dependence, unspecified, with unspecified nicotine-induced disorders: Secondary | ICD-10-CM | POA: Diagnosis not present

## 2023-06-21 DIAGNOSIS — Z01818 Encounter for other preprocedural examination: Secondary | ICD-10-CM | POA: Diagnosis not present

## 2023-06-21 DIAGNOSIS — R001 Bradycardia, unspecified: Secondary | ICD-10-CM | POA: Diagnosis not present

## 2023-06-21 DIAGNOSIS — R918 Other nonspecific abnormal finding of lung field: Secondary | ICD-10-CM | POA: Diagnosis not present

## 2023-06-21 DIAGNOSIS — Z87891 Personal history of nicotine dependence: Secondary | ICD-10-CM | POA: Diagnosis not present

## 2023-06-21 DIAGNOSIS — K219 Gastro-esophageal reflux disease without esophagitis: Secondary | ICD-10-CM | POA: Diagnosis not present

## 2023-06-21 DIAGNOSIS — R911 Solitary pulmonary nodule: Secondary | ICD-10-CM | POA: Diagnosis not present

## 2023-06-21 DIAGNOSIS — R0602 Shortness of breath: Secondary | ICD-10-CM | POA: Diagnosis not present

## 2023-06-21 DIAGNOSIS — J439 Emphysema, unspecified: Secondary | ICD-10-CM | POA: Diagnosis not present

## 2023-06-21 DIAGNOSIS — F419 Anxiety disorder, unspecified: Secondary | ICD-10-CM | POA: Diagnosis not present

## 2023-06-21 DIAGNOSIS — C3490 Malignant neoplasm of unspecified part of unspecified bronchus or lung: Secondary | ICD-10-CM | POA: Diagnosis not present

## 2023-06-22 ENCOUNTER — Encounter: Payer: Self-pay | Admitting: *Deleted

## 2023-06-22 NOTE — Progress Notes (Signed)
Patient was seen by Dr Ewing Schlein and plan for THORACOSCOPIC SEGMENTECTOMY THORACOSCOPIC MEDIASTINAL AND REGIONAL LYMPHADENECTOMY. After labs were drawn, it was found that patient had elevated liver enzymes. Patient admitted to taking tylenol and ibuprofen in higher than instructed quantities. Dr Ewing Schlein still plans on surgery, but will delay for several weeks to check for liver to return to baseline levels.   Oncology Nurse Navigator Documentation     06/22/2023   12:00 PM  Oncology Nurse Navigator Flowsheets  Navigator Follow Up Date: 08/17/2023  Navigator Follow Up Reason: Surgery  Navigator Location [facility]  Navigator Encounter Type Appt/Treatment Plan Review  Patient Visit Type MedOnc  Treatment Phase Pre-Tx/Tx Discussion  Barriers/Navigation Needs Coordination of Care;Education  Interventions None Required  Acuity Level 2-Minimal Needs (1-2 Barriers Identified)  Time Spent with Patient 15

## 2023-06-25 DIAGNOSIS — M549 Dorsalgia, unspecified: Secondary | ICD-10-CM | POA: Diagnosis not present

## 2023-06-25 DIAGNOSIS — F17210 Nicotine dependence, cigarettes, uncomplicated: Secondary | ICD-10-CM | POA: Diagnosis not present

## 2023-06-25 DIAGNOSIS — G8929 Other chronic pain: Secondary | ICD-10-CM | POA: Diagnosis not present

## 2023-06-25 DIAGNOSIS — R7401 Elevation of levels of liver transaminase levels: Secondary | ICD-10-CM | POA: Diagnosis not present

## 2023-06-29 DIAGNOSIS — F17209 Nicotine dependence, unspecified, with unspecified nicotine-induced disorders: Secondary | ICD-10-CM | POA: Diagnosis not present

## 2023-06-29 DIAGNOSIS — J439 Emphysema, unspecified: Secondary | ICD-10-CM | POA: Diagnosis not present

## 2023-06-29 DIAGNOSIS — F419 Anxiety disorder, unspecified: Secondary | ICD-10-CM | POA: Diagnosis not present

## 2023-07-20 DIAGNOSIS — J439 Emphysema, unspecified: Secondary | ICD-10-CM | POA: Diagnosis not present

## 2023-07-20 DIAGNOSIS — F17209 Nicotine dependence, unspecified, with unspecified nicotine-induced disorders: Secondary | ICD-10-CM | POA: Diagnosis not present

## 2023-07-20 DIAGNOSIS — F419 Anxiety disorder, unspecified: Secondary | ICD-10-CM | POA: Diagnosis not present

## 2023-07-26 DIAGNOSIS — Z87891 Personal history of nicotine dependence: Secondary | ICD-10-CM | POA: Diagnosis not present

## 2023-07-26 DIAGNOSIS — F419 Anxiety disorder, unspecified: Secondary | ICD-10-CM | POA: Diagnosis not present

## 2023-07-26 DIAGNOSIS — J439 Emphysema, unspecified: Secondary | ICD-10-CM | POA: Diagnosis not present

## 2023-07-26 DIAGNOSIS — K219 Gastro-esophageal reflux disease without esophagitis: Secondary | ICD-10-CM | POA: Diagnosis not present

## 2023-07-26 DIAGNOSIS — R911 Solitary pulmonary nodule: Secondary | ICD-10-CM | POA: Diagnosis not present

## 2023-07-26 DIAGNOSIS — Z2821 Immunization not carried out because of patient refusal: Secondary | ICD-10-CM | POA: Diagnosis not present

## 2023-08-08 DIAGNOSIS — F3341 Major depressive disorder, recurrent, in partial remission: Secondary | ICD-10-CM | POA: Diagnosis not present

## 2023-08-08 DIAGNOSIS — R911 Solitary pulmonary nodule: Secondary | ICD-10-CM | POA: Diagnosis not present

## 2023-08-08 DIAGNOSIS — F411 Generalized anxiety disorder: Secondary | ICD-10-CM | POA: Diagnosis not present

## 2023-08-15 DIAGNOSIS — F331 Major depressive disorder, recurrent, moderate: Secondary | ICD-10-CM | POA: Diagnosis not present

## 2023-08-15 DIAGNOSIS — F4323 Adjustment disorder with mixed anxiety and depressed mood: Secondary | ICD-10-CM | POA: Diagnosis not present

## 2023-08-15 DIAGNOSIS — Z5181 Encounter for therapeutic drug level monitoring: Secondary | ICD-10-CM | POA: Diagnosis not present

## 2023-08-17 ENCOUNTER — Encounter: Payer: Self-pay | Admitting: *Deleted

## 2023-08-17 DIAGNOSIS — R918 Other nonspecific abnormal finding of lung field: Secondary | ICD-10-CM | POA: Diagnosis not present

## 2023-08-17 DIAGNOSIS — C3411 Malignant neoplasm of upper lobe, right bronchus or lung: Secondary | ICD-10-CM | POA: Diagnosis not present

## 2023-08-17 DIAGNOSIS — C3490 Malignant neoplasm of unspecified part of unspecified bronchus or lung: Secondary | ICD-10-CM | POA: Diagnosis not present

## 2023-08-17 DIAGNOSIS — Z4682 Encounter for fitting and adjustment of non-vascular catheter: Secondary | ICD-10-CM | POA: Diagnosis not present

## 2023-08-17 DIAGNOSIS — J439 Emphysema, unspecified: Secondary | ICD-10-CM | POA: Diagnosis not present

## 2023-08-17 NOTE — Progress Notes (Signed)
 Patient had VATS right upper lobe wedge resection with completion upper lobectomy and lymphadenectomy completed today in the [facility]. Will follow for path.   Oncology Nurse Navigator Documentation     08/17/2023   12:00 PM  Oncology Nurse Navigator Flowsheets  Phase of Treatment Surgery  Surgery Actual Start Date: 08/17/2023  Navigator Follow Up Date: 08/21/2023  Navigator Follow Up Reason: Pathology  Navigator Location [facility]  Navigator Encounter Type Appt/Treatment Plan Review  Treatment Initiated Date 08/17/2023  Patient Visit Type MedOnc  Treatment Phase Active Tx  Barriers/Navigation Needs Coordination of Care;Education  Interventions None Required  Acuity Level 2-Minimal Needs (1-2 Barriers Identified)  Time Spent with Patient 15

## 2023-08-18 DIAGNOSIS — C3490 Malignant neoplasm of unspecified part of unspecified bronchus or lung: Secondary | ICD-10-CM | POA: Diagnosis not present

## 2023-08-19 DIAGNOSIS — R918 Other nonspecific abnormal finding of lung field: Secondary | ICD-10-CM | POA: Diagnosis not present

## 2023-08-30 ENCOUNTER — Encounter: Payer: Self-pay | Admitting: *Deleted

## 2023-08-30 NOTE — Progress Notes (Signed)
 Path reviewed with Dr Myna Hidalgo. Per Dr Myna Hidalgo, request for [facility] Testing sent on specimen [phone] DOS 08/17/2023 from [facility].   Called and spoke to patient. She anticipates following up in this office once she heals. She agrees for scheduling to call for an appointment in 3-4 weeks. Message sent to scheduling.   Oncology Nurse Navigator Documentation     08/30/2023    2:45 PM  Oncology Nurse Navigator Flowsheets  Navigator Follow Up Date: 09/26/2023  Navigator Follow Up Reason: Follow-up Appointment  Navigator Location [facility]  Navigator Encounter Type Molecular Studies;Telephone  Telephone Outgoing Call  Patient Visit Type MedOnc  Treatment Phase Active Tx  Barriers/Navigation Needs Coordination of Care;Education  Education Other  Interventions Coordination of Care;Education;Psycho-Social Support  Acuity Level 2-Minimal Needs (1-2 Barriers Identified)  Coordination of Care Appts;Pathology  Education Method Verbal  Time Spent with Patient 45

## 2023-08-31 DIAGNOSIS — F3341 Major depressive disorder, recurrent, in partial remission: Secondary | ICD-10-CM | POA: Diagnosis not present

## 2023-08-31 DIAGNOSIS — Z681 Body mass index (BMI) 19 or less, adult: Secondary | ICD-10-CM | POA: Diagnosis not present

## 2023-08-31 DIAGNOSIS — Z4802 Encounter for removal of sutures: Secondary | ICD-10-CM | POA: Diagnosis not present

## 2023-08-31 DIAGNOSIS — R911 Solitary pulmonary nodule: Secondary | ICD-10-CM | POA: Diagnosis not present

## 2023-09-05 DIAGNOSIS — R911 Solitary pulmonary nodule: Secondary | ICD-10-CM | POA: Diagnosis not present

## 2023-09-05 DIAGNOSIS — F419 Anxiety disorder, unspecified: Secondary | ICD-10-CM | POA: Diagnosis not present

## 2023-09-05 DIAGNOSIS — Z79899 Other long term (current) drug therapy: Secondary | ICD-10-CM | POA: Diagnosis not present

## 2023-09-05 DIAGNOSIS — Z4682 Encounter for fitting and adjustment of non-vascular catheter: Secondary | ICD-10-CM | POA: Diagnosis not present

## 2023-09-05 DIAGNOSIS — J90 Pleural effusion, not elsewhere classified: Secondary | ICD-10-CM | POA: Diagnosis not present

## 2023-09-05 DIAGNOSIS — Z87730 Personal history of (corrected) cleft lip and palate: Secondary | ICD-10-CM | POA: Diagnosis not present

## 2023-09-05 DIAGNOSIS — J9819 Other pulmonary collapse: Secondary | ICD-10-CM | POA: Diagnosis not present

## 2023-09-05 DIAGNOSIS — J948 Other specified pleural conditions: Secondary | ICD-10-CM | POA: Diagnosis not present

## 2023-09-05 DIAGNOSIS — C3490 Malignant neoplasm of unspecified part of unspecified bronchus or lung: Secondary | ICD-10-CM | POA: Diagnosis not present

## 2023-09-05 DIAGNOSIS — Z87891 Personal history of nicotine dependence: Secondary | ICD-10-CM | POA: Diagnosis not present

## 2023-09-05 DIAGNOSIS — R918 Other nonspecific abnormal finding of lung field: Secondary | ICD-10-CM | POA: Diagnosis not present

## 2023-09-05 DIAGNOSIS — J439 Emphysema, unspecified: Secondary | ICD-10-CM | POA: Diagnosis not present

## 2023-09-05 DIAGNOSIS — R1310 Dysphagia, unspecified: Secondary | ICD-10-CM | POA: Diagnosis not present

## 2023-09-05 DIAGNOSIS — C3411 Malignant neoplasm of upper lobe, right bronchus or lung: Secondary | ICD-10-CM | POA: Diagnosis not present

## 2023-09-05 DIAGNOSIS — Z452 Encounter for adjustment and management of vascular access device: Secondary | ICD-10-CM | POA: Diagnosis not present

## 2023-09-05 DIAGNOSIS — J939 Pneumothorax, unspecified: Secondary | ICD-10-CM | POA: Diagnosis not present

## 2023-09-05 DIAGNOSIS — Z902 Acquired absence of lung [part of]: Secondary | ICD-10-CM | POA: Diagnosis not present

## 2023-09-05 DIAGNOSIS — R0602 Shortness of breath: Secondary | ICD-10-CM | POA: Diagnosis not present

## 2023-09-05 DIAGNOSIS — C3491 Malignant neoplasm of unspecified part of right bronchus or lung: Secondary | ICD-10-CM | POA: Diagnosis not present

## 2023-09-05 DIAGNOSIS — T797XXA Traumatic subcutaneous emphysema, initial encounter: Secondary | ICD-10-CM | POA: Diagnosis not present

## 2023-09-05 DIAGNOSIS — J95811 Postprocedural pneumothorax: Secondary | ICD-10-CM | POA: Diagnosis not present

## 2023-09-06 DIAGNOSIS — J90 Pleural effusion, not elsewhere classified: Secondary | ICD-10-CM | POA: Diagnosis not present

## 2023-09-06 DIAGNOSIS — J939 Pneumothorax, unspecified: Secondary | ICD-10-CM | POA: Diagnosis not present

## 2023-09-06 DIAGNOSIS — Z4682 Encounter for fitting and adjustment of non-vascular catheter: Secondary | ICD-10-CM | POA: Diagnosis not present

## 2023-09-06 DIAGNOSIS — R0602 Shortness of breath: Secondary | ICD-10-CM | POA: Diagnosis not present

## 2023-09-06 DIAGNOSIS — R918 Other nonspecific abnormal finding of lung field: Secondary | ICD-10-CM | POA: Diagnosis not present

## 2023-09-06 DIAGNOSIS — C3491 Malignant neoplasm of unspecified part of right bronchus or lung: Secondary | ICD-10-CM | POA: Diagnosis not present

## 2023-09-06 DIAGNOSIS — J948 Other specified pleural conditions: Secondary | ICD-10-CM | POA: Diagnosis not present

## 2023-09-07 DIAGNOSIS — J939 Pneumothorax, unspecified: Secondary | ICD-10-CM | POA: Diagnosis not present

## 2023-09-07 DIAGNOSIS — Z4682 Encounter for fitting and adjustment of non-vascular catheter: Secondary | ICD-10-CM | POA: Diagnosis not present

## 2023-09-26 ENCOUNTER — Ambulatory Visit: Payer: BC Managed Care – PPO | Admitting: Hematology & Oncology

## 2023-09-26 ENCOUNTER — Inpatient Hospital Stay: Payer: BC Managed Care – PPO

## 2023-10-03 DIAGNOSIS — F4323 Adjustment disorder with mixed anxiety and depressed mood: Secondary | ICD-10-CM | POA: Diagnosis not present

## 2023-10-03 DIAGNOSIS — F331 Major depressive disorder, recurrent, moderate: Secondary | ICD-10-CM | POA: Diagnosis not present

## 2023-10-04 DIAGNOSIS — G8918 Other acute postprocedural pain: Secondary | ICD-10-CM | POA: Diagnosis not present

## 2023-10-04 DIAGNOSIS — Z902 Acquired absence of lung [part of]: Secondary | ICD-10-CM | POA: Diagnosis not present

## 2023-10-04 DIAGNOSIS — C3411 Malignant neoplasm of upper lobe, right bronchus or lung: Secondary | ICD-10-CM | POA: Diagnosis not present

## 2023-10-04 DIAGNOSIS — J948 Other specified pleural conditions: Secondary | ICD-10-CM | POA: Diagnosis not present

## 2023-10-04 DIAGNOSIS — Z79899 Other long term (current) drug therapy: Secondary | ICD-10-CM | POA: Diagnosis not present

## 2023-10-04 DIAGNOSIS — R911 Solitary pulmonary nodule: Secondary | ICD-10-CM | POA: Diagnosis not present

## 2023-10-04 DIAGNOSIS — J439 Emphysema, unspecified: Secondary | ICD-10-CM | POA: Diagnosis not present

## 2023-10-04 DIAGNOSIS — Z87891 Personal history of nicotine dependence: Secondary | ICD-10-CM | POA: Diagnosis not present

## 2023-10-04 DIAGNOSIS — Z4802 Encounter for removal of sutures: Secondary | ICD-10-CM | POA: Diagnosis not present

## 2023-10-04 DIAGNOSIS — R4586 Emotional lability: Secondary | ICD-10-CM | POA: Diagnosis not present

## 2023-10-15 ENCOUNTER — Encounter: Payer: Self-pay | Admitting: *Deleted

## 2023-10-15 NOTE — Progress Notes (Unsigned)
 Patient cancelled her f/u appointment last month and stated she would reschedule. This appointment still hasn't been rescheduled. Called patient at 0930 to follow up. Voicemail requesting call back left.   Oncology Nurse Navigator Documentation     10/15/2023    9:15 AM  Oncology Nurse Navigator Flowsheets  Navigator Location [facility]  Navigator Encounter Type Telephone  Telephone Outgoing Call  Patient Visit Type MedOnc  Treatment Phase Active Tx  Barriers/Navigation Needs Coordination of Care;Education  Interventions Coordination of Care  Acuity Level 2-Minimal Needs (1-2 Barriers Identified)  Coordination of Care Appts  Time Spent with Patient 15

## 2023-10-17 ENCOUNTER — Encounter: Payer: Self-pay | Admitting: *Deleted

## 2023-10-17 NOTE — Progress Notes (Signed)
 Called patient again today and made contact. Patient states she had a successful surgery, however a few weeks after she went home, she had a spontaneous pneumothorax that required re-hospitalization. She is just now starting to recover from this set back.   At this time she wishes to just follow with Dr Ewing Schlein. She states he has a follow up plan in place and her next appointment is already scheduled. She does not believe she needs to follow with [place] at this time. She is encouraged to call our office with any future needs.   [facility] report faxed to Dr Ewing Schlein  Will discontinue active navigation at this time.   Oncology Nurse Navigator Documentation     10/17/2023   11:45 AM  Oncology Nurse Navigator Flowsheets  Navigation Complete Date: 10/17/2023  Post Navigation: Continue to Follow Patient? No  Reason Not Navigating Patient: Seeking Care elsewhere  Navigator Location [facility]  Navigator Encounter Type Telephone  Telephone Outgoing Call  Patient Visit Type MedOnc  Treatment Phase Post-Tx Follow-up  Barriers/Navigation Needs No Barriers At This Time  Interventions Psycho-Social Support;Coordination of Care  Acuity Level 1-No Barriers  Coordination of Care Appts  Time Spent with Patient 15

## 2023-11-05 DIAGNOSIS — J439 Emphysema, unspecified: Secondary | ICD-10-CM | POA: Diagnosis not present

## 2023-11-07 DIAGNOSIS — K219 Gastro-esophageal reflux disease without esophagitis: Secondary | ICD-10-CM | POA: Diagnosis not present

## 2023-11-07 DIAGNOSIS — F430 Acute stress reaction: Secondary | ICD-10-CM | POA: Diagnosis not present

## 2023-11-07 DIAGNOSIS — J439 Emphysema, unspecified: Secondary | ICD-10-CM | POA: Diagnosis not present

## 2023-11-07 DIAGNOSIS — F4323 Adjustment disorder with mixed anxiety and depressed mood: Secondary | ICD-10-CM | POA: Diagnosis not present

## 2023-11-07 DIAGNOSIS — F331 Major depressive disorder, recurrent, moderate: Secondary | ICD-10-CM | POA: Diagnosis not present

## 2023-11-07 DIAGNOSIS — R7303 Prediabetes: Secondary | ICD-10-CM | POA: Diagnosis not present

## 2023-11-08 DIAGNOSIS — J439 Emphysema, unspecified: Secondary | ICD-10-CM | POA: Diagnosis not present

## 2023-11-12 DIAGNOSIS — J439 Emphysema, unspecified: Secondary | ICD-10-CM | POA: Diagnosis not present

## 2023-11-13 DIAGNOSIS — F4323 Adjustment disorder with mixed anxiety and depressed mood: Secondary | ICD-10-CM | POA: Diagnosis not present

## 2023-11-13 DIAGNOSIS — F331 Major depressive disorder, recurrent, moderate: Secondary | ICD-10-CM | POA: Diagnosis not present

## 2023-11-14 DIAGNOSIS — J439 Emphysema, unspecified: Secondary | ICD-10-CM | POA: Diagnosis not present

## 2023-11-15 DIAGNOSIS — J439 Emphysema, unspecified: Secondary | ICD-10-CM | POA: Diagnosis not present

## 2023-11-19 DIAGNOSIS — J439 Emphysema, unspecified: Secondary | ICD-10-CM | POA: Diagnosis not present

## 2023-11-21 DIAGNOSIS — J439 Emphysema, unspecified: Secondary | ICD-10-CM | POA: Diagnosis not present

## 2023-11-22 DIAGNOSIS — J439 Emphysema, unspecified: Secondary | ICD-10-CM | POA: Diagnosis not present

## 2023-11-26 DIAGNOSIS — J439 Emphysema, unspecified: Secondary | ICD-10-CM | POA: Diagnosis not present

## 2023-11-28 DIAGNOSIS — J439 Emphysema, unspecified: Secondary | ICD-10-CM | POA: Diagnosis not present

## 2023-11-29 DIAGNOSIS — J439 Emphysema, unspecified: Secondary | ICD-10-CM | POA: Diagnosis not present

## 2023-12-05 DIAGNOSIS — J439 Emphysema, unspecified: Secondary | ICD-10-CM | POA: Diagnosis not present

## 2023-12-06 DIAGNOSIS — J439 Emphysema, unspecified: Secondary | ICD-10-CM | POA: Diagnosis not present

## 2023-12-07 DIAGNOSIS — F4323 Adjustment disorder with mixed anxiety and depressed mood: Secondary | ICD-10-CM | POA: Diagnosis not present

## 2023-12-07 DIAGNOSIS — F331 Major depressive disorder, recurrent, moderate: Secondary | ICD-10-CM | POA: Diagnosis not present

## 2023-12-12 DIAGNOSIS — J439 Emphysema, unspecified: Secondary | ICD-10-CM | POA: Diagnosis not present

## 2023-12-13 DIAGNOSIS — M5416 Radiculopathy, lumbar region: Secondary | ICD-10-CM | POA: Diagnosis not present

## 2023-12-14 DIAGNOSIS — Z87891 Personal history of nicotine dependence: Secondary | ICD-10-CM | POA: Diagnosis not present

## 2023-12-14 DIAGNOSIS — B349 Viral infection, unspecified: Secondary | ICD-10-CM | POA: Diagnosis not present

## 2023-12-14 DIAGNOSIS — F419 Anxiety disorder, unspecified: Secondary | ICD-10-CM | POA: Diagnosis not present

## 2023-12-14 DIAGNOSIS — J439 Emphysema, unspecified: Secondary | ICD-10-CM | POA: Diagnosis not present

## 2023-12-17 DIAGNOSIS — J439 Emphysema, unspecified: Secondary | ICD-10-CM | POA: Diagnosis not present

## 2023-12-31 DIAGNOSIS — J439 Emphysema, unspecified: Secondary | ICD-10-CM | POA: Diagnosis not present

## 2024-01-02 DIAGNOSIS — J439 Emphysema, unspecified: Secondary | ICD-10-CM | POA: Diagnosis not present

## 2024-01-03 DIAGNOSIS — J439 Emphysema, unspecified: Secondary | ICD-10-CM | POA: Diagnosis not present

## 2024-01-04 DIAGNOSIS — F4323 Adjustment disorder with mixed anxiety and depressed mood: Secondary | ICD-10-CM | POA: Diagnosis not present

## 2024-01-07 DIAGNOSIS — J439 Emphysema, unspecified: Secondary | ICD-10-CM | POA: Diagnosis not present

## 2024-01-28 DIAGNOSIS — M5416 Radiculopathy, lumbar region: Secondary | ICD-10-CM | POA: Diagnosis not present

## 2024-02-15 DIAGNOSIS — F4323 Adjustment disorder with mixed anxiety and depressed mood: Secondary | ICD-10-CM | POA: Diagnosis not present

## 2024-02-15 DIAGNOSIS — F331 Major depressive disorder, recurrent, moderate: Secondary | ICD-10-CM | POA: Diagnosis not present

## 2024-02-21 DIAGNOSIS — F4323 Adjustment disorder with mixed anxiety and depressed mood: Secondary | ICD-10-CM | POA: Diagnosis not present

## 2024-02-21 DIAGNOSIS — M4727 Other spondylosis with radiculopathy, lumbosacral region: Secondary | ICD-10-CM | POA: Diagnosis not present

## 2024-02-22 DIAGNOSIS — M4726 Other spondylosis with radiculopathy, lumbar region: Secondary | ICD-10-CM | POA: Diagnosis not present

## 2024-02-27 ENCOUNTER — Other Ambulatory Visit: Payer: Self-pay | Admitting: Physician Assistant

## 2024-02-27 DIAGNOSIS — M5416 Radiculopathy, lumbar region: Secondary | ICD-10-CM

## 2024-03-05 DIAGNOSIS — J95811 Postprocedural pneumothorax: Secondary | ICD-10-CM | POA: Diagnosis not present

## 2024-03-05 DIAGNOSIS — C3411 Malignant neoplasm of upper lobe, right bronchus or lung: Secondary | ICD-10-CM | POA: Diagnosis not present

## 2024-03-05 DIAGNOSIS — R911 Solitary pulmonary nodule: Secondary | ICD-10-CM | POA: Diagnosis not present

## 2024-03-05 DIAGNOSIS — Z87891 Personal history of nicotine dependence: Secondary | ICD-10-CM | POA: Diagnosis not present

## 2024-03-05 DIAGNOSIS — Z902 Acquired absence of lung [part of]: Secondary | ICD-10-CM | POA: Diagnosis not present

## 2024-03-05 DIAGNOSIS — R0609 Other forms of dyspnea: Secondary | ICD-10-CM | POA: Diagnosis not present

## 2024-03-05 DIAGNOSIS — K5792 Diverticulitis of intestine, part unspecified, without perforation or abscess without bleeding: Secondary | ICD-10-CM | POA: Diagnosis not present

## 2024-03-07 NOTE — Discharge Instructions (Signed)

## 2024-03-11 ENCOUNTER — Ambulatory Visit
Admission: RE | Admit: 2024-03-11 | Discharge: 2024-03-11 | Disposition: A | Discharge status: Home / Self Care | Source: Ambulatory Visit | Attending: Physician Assistant | Admitting: Physician Assistant

## 2024-03-11 DIAGNOSIS — M545 Low back pain, unspecified: Secondary | ICD-10-CM | POA: Diagnosis not present

## 2024-03-11 DIAGNOSIS — M79661 Pain in right lower leg: Secondary | ICD-10-CM | POA: Diagnosis not present

## 2024-03-11 DIAGNOSIS — M5416 Radiculopathy, lumbar region: Secondary | ICD-10-CM

## 2024-03-11 DIAGNOSIS — M79662 Pain in left lower leg: Secondary | ICD-10-CM | POA: Diagnosis not present

## 2024-03-11 MED ORDER — METHYLPREDNISOLONE ACETATE 40 MG/ML INJ SUSP (RADIOLOG
80.0000 mg | Freq: Once | INTRAMUSCULAR | Status: AC
  Administered 2024-03-11: 80 mg via EPIDURAL

## 2024-03-11 MED ORDER — IOPAMIDOL (ISOVUE-M 200) INJECTION 41%
1.0000 mL | Freq: Once | INTRAMUSCULAR | Status: AC
  Administered 2024-03-11: 1 mL via EPIDURAL

## 2024-03-12 DIAGNOSIS — G5602 Carpal tunnel syndrome, left upper limb: Secondary | ICD-10-CM | POA: Diagnosis not present

## 2024-03-12 DIAGNOSIS — M19031 Primary osteoarthritis, right wrist: Secondary | ICD-10-CM | POA: Diagnosis not present

## 2024-03-12 DIAGNOSIS — M19032 Primary osteoarthritis, left wrist: Secondary | ICD-10-CM | POA: Diagnosis not present

## 2024-03-27 ENCOUNTER — Other Ambulatory Visit: Payer: Self-pay | Admitting: Physician Assistant

## 2024-03-27 DIAGNOSIS — M5416 Radiculopathy, lumbar region: Secondary | ICD-10-CM

## 2024-04-01 DIAGNOSIS — F4323 Adjustment disorder with mixed anxiety and depressed mood: Secondary | ICD-10-CM | POA: Diagnosis not present

## 2024-04-14 ENCOUNTER — Ambulatory Visit
Admission: RE | Admit: 2024-04-14 | Discharge: 2024-04-14 | Disposition: A | Discharge status: Home / Self Care | Source: Ambulatory Visit | Attending: Physician Assistant | Admitting: Physician Assistant

## 2024-04-14 DIAGNOSIS — M47817 Spondylosis without myelopathy or radiculopathy, lumbosacral region: Secondary | ICD-10-CM | POA: Diagnosis not present

## 2024-04-14 DIAGNOSIS — M5416 Radiculopathy, lumbar region: Secondary | ICD-10-CM

## 2024-04-14 MED ORDER — METHYLPREDNISOLONE ACETATE 40 MG/ML INJ SUSP (RADIOLOG
80.0000 mg | Freq: Once | INTRAMUSCULAR | Status: AC
Start: 2024-04-14 — End: 2024-04-14
  Administered 2024-04-14: 80 mg via EPIDURAL

## 2024-04-14 MED ORDER — IOPAMIDOL (ISOVUE-M 200) INJECTION 41%
1.0000 mL | Freq: Once | INTRAMUSCULAR | Status: AC
  Administered 2024-04-14: 1 mL via EPIDURAL

## 2024-04-14 NOTE — Discharge Instructions (Signed)

## 2024-04-15 ENCOUNTER — Other Ambulatory Visit: Payer: Self-pay

## 2024-04-15 MED ORDER — SPIRIVA RESPIMAT 2.5 MCG/ACT IN AERS: 2.0000 | INHALATION_SPRAY | Freq: Every day | RESPIRATORY_TRACT | 0 refills | Status: AC

## 2024-04-16 DIAGNOSIS — M19032 Primary osteoarthritis, left wrist: Secondary | ICD-10-CM | POA: Diagnosis not present

## 2024-04-16 DIAGNOSIS — G5602 Carpal tunnel syndrome, left upper limb: Secondary | ICD-10-CM | POA: Diagnosis not present

## 2024-04-16 DIAGNOSIS — M19031 Primary osteoarthritis, right wrist: Secondary | ICD-10-CM | POA: Diagnosis not present

## 2024-04-18 DIAGNOSIS — F331 Major depressive disorder, recurrent, moderate: Secondary | ICD-10-CM | POA: Diagnosis not present

## 2024-04-18 DIAGNOSIS — F4323 Adjustment disorder with mixed anxiety and depressed mood: Secondary | ICD-10-CM | POA: Diagnosis not present

## 2024-04-21 ENCOUNTER — Encounter: Payer: Self-pay | Admitting: *Deleted

## 2024-05-15 DIAGNOSIS — M62542 Muscle wasting and atrophy, not elsewhere classified, left hand: Secondary | ICD-10-CM | POA: Diagnosis not present

## 2024-05-15 DIAGNOSIS — M25642 Stiffness of left hand, not elsewhere classified: Secondary | ICD-10-CM | POA: Diagnosis not present

## 2024-05-15 DIAGNOSIS — G5602 Carpal tunnel syndrome, left upper limb: Secondary | ICD-10-CM | POA: Diagnosis not present

## 2024-05-15 DIAGNOSIS — M79642 Pain in left hand: Secondary | ICD-10-CM | POA: Diagnosis not present

## 2024-05-22 DIAGNOSIS — G5602 Carpal tunnel syndrome, left upper limb: Secondary | ICD-10-CM | POA: Diagnosis not present

## 2024-05-22 DIAGNOSIS — M79642 Pain in left hand: Secondary | ICD-10-CM | POA: Diagnosis not present

## 2024-05-22 DIAGNOSIS — M25642 Stiffness of left hand, not elsewhere classified: Secondary | ICD-10-CM | POA: Diagnosis not present

## 2024-05-22 DIAGNOSIS — M62542 Muscle wasting and atrophy, not elsewhere classified, left hand: Secondary | ICD-10-CM | POA: Diagnosis not present

## 2024-05-26 ENCOUNTER — Telehealth: Payer: Self-pay

## 2024-05-26 ENCOUNTER — Ambulatory Visit: Admitting: Pulmonary Disease

## 2024-05-26 ENCOUNTER — Encounter: Payer: Self-pay | Admitting: Pulmonary Disease

## 2024-05-26 VITALS — BP 145/83 | HR 93 | Ht 65.0 in | Wt 131.6 lb

## 2024-05-26 DIAGNOSIS — R0609 Other forms of dyspnea: Secondary | ICD-10-CM | POA: Diagnosis not present

## 2024-05-26 DIAGNOSIS — J449 Chronic obstructive pulmonary disease, unspecified: Secondary | ICD-10-CM

## 2024-05-26 DIAGNOSIS — J439 Emphysema, unspecified: Secondary | ICD-10-CM

## 2024-05-26 DIAGNOSIS — Z902 Acquired absence of lung [part of]: Secondary | ICD-10-CM | POA: Diagnosis not present

## 2024-05-26 MED ORDER — AMOXICILLIN-POT CLAVULANATE 875-125 MG PO TABS: 1.0000 | ORAL_TABLET | Freq: Two times a day (BID) | ORAL | 0 refills | Status: AC

## 2024-05-26 MED ORDER — STIOLTO RESPIMAT 2.5-2.5 MCG/ACT IN AERS: 2.0000 | INHALATION_SPRAY | Freq: Every day | RESPIRATORY_TRACT | 6 refills | Status: AC

## 2024-05-26 MED ORDER — METHYLPREDNISOLONE ACETATE 80 MG/ML IJ SUSP
80.0000 mg | Freq: Once | INTRAMUSCULAR | Status: AC
  Administered 2024-05-26: 80 mg via INTRAMUSCULAR

## 2024-05-26 NOTE — Patient Instructions (Signed)
 Nice to meet you  The shortness of breath after the surgery may be because of removing some lung that was functional.  Lets see if we can make some improvement with change in inhaler.  Stop Spiriva , use Stiolto new inhaler, 2 puffs once a day.  It looks just like Spiriva  but adds additional medicine.  There is no steroids and no increased recent thrush.  If the Stiolto is too expensive, continue Spiriva  and send me a message.  I will look for more cost-effective solution.  For the cough and sputum production and early bronchitis, steroid shot today in the office.  Take Augmentin 1 tablet twice a day for 7 days as well.  Return to clinic in 3 months or sooner as needed with Dr. Annella

## 2024-05-26 NOTE — Progress Notes (Signed)
 @Patient  ID: Alexis Davila, female    DOB: 1960/09/27, 63 y.o.   MRN: [id number]  Chief Complaint  Patient presents with   Medical Management of Chronic Issues    PT states establish pulmonary MD    Referring provider: Alvera Reagin, PA  HPI:   63 y.o. woman with COPD and lung cancer status post right upper lobe lobectomy 08/2023 at [facility] whom were seeing for follow up of the same.  Here to establish care with new physician.  Most recent pulmonary note from Dr. Brenna reviewed.  Multiple hospital and clinic notes from [facility] system reviewed.  Previously followed by Dr. Brenna here in clinic.  COPD, mild, on prior PFTs.  Underwent right upper lobe lobectomy at [facility] 08/2023.  Course complicated by readmittance for development of hydropneumothorax postprocedural.  Underwent chest tube placement x 2.  Eventually lung reinflated.  1 chest tube was removed.  Second chest tube was suddenly removed after clamping trial with good lung expansion.  Repeat chest x-ray revealed stability.  She underwent interval CT scan 02/2024, 63-month surveillance scan that demonstrated no concern for recurrence, some mild thickening on the right, no hydropneumothorax.  Since reduce been more short of breath.  Notices more shortness of breath at work.  We discussed likely after lobectomy send she had no issues prior to this.  Her prior PFTs were very mild and her imaging is relatively mild in terms of emphysema.  She has had some right back pain.  After inability use left arm.  I suspect this is MSK in nature.  Also around the area of her incisions, possibly postthoracotomy or exacerbated with local mild postthoracotomy symptoms.  Over the last days she has had worsening cough.  Foul tasting sputum.  More shortness of breath.  Worried about early bronchitis.  Which she often gets and per her report can settle and make things worse.  Questionaires / Pulmonary Flowsheets:   ACT:      No data to display           MMRC:     No data to display          Epworth:      No data to display          Tests:   FENO:  No results found for: NITRICOXIDE  PFT:    Latest Ref Rng & Units 04/02/2023    3:35 PM  PFT Results  FVC-Pre L 3.08   FVC-Predicted Pre % 88   FVC-Post L 3.11   FVC-Predicted Post % 89   Pre FEV1/FVC % % 67   Post FEV1/FCV % % 65   FEV1-Pre L 2.08   FEV1-Predicted Pre % 77   FEV1-Post L 2.03   DLCO uncorrected ml/min/mmHg 12.20   DLCO UNC% % 57   DLCO corrected ml/min/mmHg 12.20   DLCO COR %Predicted % 57   DLVA Predicted % 58   TLC L 5.71   TLC % Predicted % 108   RV % Predicted % 128   Personally reviewed and interpreted as chronically consistent with mild fixed obstruction, no significant bronchodilator response, lung volumes consistent with air trapping, DLCO moderately reduced  WALK:      No data to display          Imaging: Personally reviewed and per EMR and discussion this note No results found.  Lab Results: Personally reviewed via Care Everywhere CBC No results found for: WBC, RBC, HGB, HCT, PLT, MCV, MCH, MCHC, RDW,  LYMPHSABS, MONOABS, EOSABS, BASOSABS  BMET No results found for: NA, K, CL, CO2, GLUCOSE, BUN, CREATININE, CALCIUM, GFRNONAA, GFRAA  BNP No results found for: BNP  ProBNP No results found for: PROBNP  Specialty Problems       Pulmonary Problems   COPD with emphysema (HCC)   Lung nodule    No Known Allergies   There is no immunization history on file for this patient.  Past Medical History:  Diagnosis Date   Anxiety    Depression    Emphysema of lung (HCC)    GERD (gastroesophageal reflux disease)    Lung cancer (HCC)    Osteoporosis    Tobacco use     Tobacco History: Social History   Tobacco Use  Smoking Status Every Day   Current packs/day: 1.00   Types: Cigarettes  Smokeless Tobacco Never  Tobacco Comments   Smokes 1/2 ppd.  Trying to  quit.  Hfb  04/16/2023   Ready to quit: Not Answered Counseling given: Not Answered Tobacco comments: Smokes 1/2 ppd.  Trying to quit.  Hfb  04/16/2023   Continue to not smoke  Outpatient Encounter Medications as of 05/26/2024  Medication Sig   albuterol  (VENTOLIN  HFA) 108 (90 Base) MCG/ACT inhaler Inhale 1-2 puffs into the lungs every 6 (six) hours as needed.   ALPRAZolam (XANAX) 0.5 MG tablet Take 0.5 mg by mouth 2 (two) times daily as needed.   amoxicillin-clavulanate (AUGMENTIN) 875-125 MG tablet Take 1 tablet by mouth 2 (two) times daily.   clonazePAM (KLONOPIN) 0.5 MG tablet Take 0.5 mg by mouth 2 (two) times daily as needed.   escitalopram (LEXAPRO) 20 MG tablet Take 30 mg by mouth daily.   hydrOXYzine (ATARAX) 25 MG tablet Take 25 mg by mouth every 6 (six) hours as needed.   pantoprazole (PROTONIX) 40 MG tablet Take 40 mg by mouth daily.   promethazine (PHENERGAN) 25 MG tablet Take 12.5 mg by mouth every 8 (eight) hours as needed.   Tiotropium Bromide Monohydrate  (SPIRIVA  RESPIMAT) 2.5 MCG/ACT AERS Inhale 2 puffs into the lungs daily.   Facility-Administered Encounter Medications as of 05/26/2024  Medication   methylPREDNISolone  acetate (DEPO-MEDROL ) injection 80 mg     Review of Systems  Review of Systems  No chest pain with exertion.  No orthopnea or PND.  Comprehensive review of systems otherwise negative. Physical Exam  BP (!) 145/83   Pulse 93   Ht 5' 5 (1.651 m) Comment: per pt  Wt 131 lb 9.6 oz (59.7 kg)   SpO2 100%   BMI 21.90 kg/m   Wt Readings from Last 5 Encounters:  05/26/24 131 lb 9.6 oz (59.7 kg)  04/24/23 113 lb (51.3 kg)  04/20/23 113 lb 1.9 oz (51.3 kg)  04/16/23 112 lb 12.8 oz (51.2 kg)  03/21/23 101 lb (45.8 kg)    BMI Readings from Last 5 Encounters:  05/26/24 21.90 kg/m  04/24/23 19.40 kg/m  04/20/23 19.42 kg/m  04/16/23 19.36 kg/m  03/21/23 17.34 kg/m     Physical Exam General: Sitting in chair, no acute distress Eyes:  EOMI, icterus Neck: Supple, no JVP appreciated, raspy voice Pulmonary: Clear, no work of breathing, no wheeze MSK: Well-healed surgical scar right lateral and posterior mid thoracic area Cardiovascular: Warm, no edema Abdomen: Nondistended   Assessment & Plan:   COPD with early exacerbation: COPD is mild based on FEV1 on prior PFT.  Historically minimal symptoms with increased dyspnea after lobectomy.  Escalate inhalers as below.  For exacerbation, Depo-Medrol   shot today in clinic.  Augmentin for 7 days prescribed.  Exacerbation symptoms include increased dyspnea worse than new baseline, cough, new sputum production.  Fortunately lungs are clear, hopefully catching early.  Dyspnea on exertion: New since lobectomy.  Likely related to decrease in lung function after portion surgically removed.  Escalate to Stiolto, stop Spiriva .  Assess response.  It is possible or likely that no medication can overcome functional change after lobectomy.   Return in about 3 months (around 08/26/2024) for f/u Dr. Annella.   Donnice JONELLE Annella, MD 05/26/2024   This appointment required 40 minutes of patient care (this includes precharting, chart review, review of results, face-to-face care, etc.).

## 2024-05-26 NOTE — Telephone Encounter (Signed)
 Copied from CRM #[id number]. Topic: Clinical - Prescription Issue >> May 26, 2024  3:50 PM Corean SAUNDERS wrote: Reason for CRM: Patient states she was seen by Dr. Annella today and he was supposed to order the Stiolto for her but it hasn't yet been sent to her pharmacy.  Rx sent & pt is aware.

## 2024-05-29 DIAGNOSIS — G5602 Carpal tunnel syndrome, left upper limb: Secondary | ICD-10-CM | POA: Diagnosis not present

## 2024-05-29 DIAGNOSIS — M79642 Pain in left hand: Secondary | ICD-10-CM | POA: Diagnosis not present

## 2024-05-29 DIAGNOSIS — M25642 Stiffness of left hand, not elsewhere classified: Secondary | ICD-10-CM | POA: Diagnosis not present

## 2024-05-29 DIAGNOSIS — M62542 Muscle wasting and atrophy, not elsewhere classified, left hand: Secondary | ICD-10-CM | POA: Diagnosis not present

## 2024-05-30 DIAGNOSIS — F331 Major depressive disorder, recurrent, moderate: Secondary | ICD-10-CM | POA: Diagnosis not present

## 2024-05-30 DIAGNOSIS — F4323 Adjustment disorder with mixed anxiety and depressed mood: Secondary | ICD-10-CM | POA: Diagnosis not present

## 2024-06-02 DIAGNOSIS — R7303 Prediabetes: Secondary | ICD-10-CM | POA: Diagnosis not present

## 2024-06-02 DIAGNOSIS — Z13220 Encounter for screening for lipoid disorders: Secondary | ICD-10-CM | POA: Diagnosis not present

## 2024-06-02 DIAGNOSIS — Z0000 Encounter for general adult medical examination without abnormal findings: Secondary | ICD-10-CM | POA: Diagnosis not present

## 2024-06-02 DIAGNOSIS — F3341 Major depressive disorder, recurrent, in partial remission: Secondary | ICD-10-CM | POA: Diagnosis not present

## 2024-06-02 DIAGNOSIS — F419 Anxiety disorder, unspecified: Secondary | ICD-10-CM | POA: Diagnosis not present

## 2024-06-02 DIAGNOSIS — J439 Emphysema, unspecified: Secondary | ICD-10-CM | POA: Diagnosis not present

## 2024-06-02 DIAGNOSIS — Z1231 Encounter for screening mammogram for malignant neoplasm of breast: Secondary | ICD-10-CM | POA: Diagnosis not present

## 2024-06-02 DIAGNOSIS — M25642 Stiffness of left hand, not elsewhere classified: Secondary | ICD-10-CM | POA: Diagnosis not present

## 2024-06-02 DIAGNOSIS — M62542 Muscle wasting and atrophy, not elsewhere classified, left hand: Secondary | ICD-10-CM | POA: Diagnosis not present

## 2024-06-02 DIAGNOSIS — M79642 Pain in left hand: Secondary | ICD-10-CM | POA: Diagnosis not present

## 2024-06-02 DIAGNOSIS — G5602 Carpal tunnel syndrome, left upper limb: Secondary | ICD-10-CM | POA: Diagnosis not present

## 2024-06-04 DIAGNOSIS — M79642 Pain in left hand: Secondary | ICD-10-CM | POA: Diagnosis not present

## 2024-06-04 DIAGNOSIS — M25642 Stiffness of left hand, not elsewhere classified: Secondary | ICD-10-CM | POA: Diagnosis not present

## 2024-06-04 DIAGNOSIS — M62542 Muscle wasting and atrophy, not elsewhere classified, left hand: Secondary | ICD-10-CM | POA: Diagnosis not present

## 2024-06-04 DIAGNOSIS — G5602 Carpal tunnel syndrome, left upper limb: Secondary | ICD-10-CM | POA: Diagnosis not present

## 2024-06-19 ENCOUNTER — Other Ambulatory Visit: Payer: Self-pay | Admitting: Physician Assistant

## 2024-06-19 DIAGNOSIS — M5416 Radiculopathy, lumbar region: Secondary | ICD-10-CM

## 2024-06-30 DIAGNOSIS — F331 Major depressive disorder, recurrent, moderate: Secondary | ICD-10-CM | POA: Diagnosis not present

## 2024-06-30 DIAGNOSIS — F411 Generalized anxiety disorder: Secondary | ICD-10-CM | POA: Diagnosis not present

## 2024-07-01 ENCOUNTER — Ambulatory Visit
Admission: RE | Admit: 2024-07-01 | Discharge: 2024-07-01 | Disposition: A | Discharge status: Home / Self Care | Source: Ambulatory Visit | Attending: Physician Assistant | Admitting: Physician Assistant

## 2024-07-01 DIAGNOSIS — M5416 Radiculopathy, lumbar region: Secondary | ICD-10-CM

## 2024-07-01 MED ORDER — METHYLPREDNISOLONE ACETATE 40 MG/ML INJ SUSP (RADIOLOG
80.0000 mg | Freq: Once | INTRAMUSCULAR | Status: AC
  Administered 2024-07-01: 80 mg via EPIDURAL

## 2024-07-01 MED ORDER — IOPAMIDOL (ISOVUE-M 200) INJECTION 41%
1.0000 mL | Freq: Once | INTRAMUSCULAR | Status: AC
  Administered 2024-07-01: 1 mL via EPIDURAL

## 2024-08-26 ENCOUNTER — Ambulatory Visit: Admitting: Pulmonary Disease
